# Patient Record
Sex: Male | Born: 1990 | Race: Black or African American | Hispanic: No | Marital: Single | Smoking: Current every day smoker
Health system: Southern US, Community
[De-identification: ages and names within clinical notes are randomized; demographics above are authoritative.]

## PROBLEM LIST (undated history)

## (undated) DIAGNOSIS — F1910 Other psychoactive substance abuse, uncomplicated: Secondary | ICD-10-CM

## (undated) DIAGNOSIS — Z720 Tobacco use: Secondary | ICD-10-CM

## (undated) DIAGNOSIS — F1010 Alcohol abuse, uncomplicated: Secondary | ICD-10-CM

## (undated) HISTORY — PX: TONSILLECTOMY: SUR1361

---

## 2014-07-18 ENCOUNTER — Emergency Department (HOSPITAL_COMMUNITY)
Admission: EM | Admit: 2014-07-18 | Discharge: 2014-07-19 | Disposition: A | Discharge status: Home / Self Care | Payer: Self-pay | Attending: Emergency Medicine | Admitting: Emergency Medicine

## 2014-07-18 ENCOUNTER — Encounter (HOSPITAL_COMMUNITY): Payer: Self-pay | Admitting: Emergency Medicine

## 2014-07-18 DIAGNOSIS — N492 Inflammatory disorders of scrotum: Secondary | ICD-10-CM | POA: Insufficient documentation

## 2014-07-18 DIAGNOSIS — N50819 Testicular pain, unspecified: Secondary | ICD-10-CM

## 2014-07-18 DIAGNOSIS — L089 Local infection of the skin and subcutaneous tissue, unspecified: Secondary | ICD-10-CM | POA: Insufficient documentation

## 2014-07-18 DIAGNOSIS — Z720 Tobacco use: Secondary | ICD-10-CM | POA: Insufficient documentation

## 2014-07-18 LAB — COMPREHENSIVE METABOLIC PANEL
ALK PHOS: 72 U/L (ref 39–117)
ALT: 15 U/L (ref 0–53)
AST: 22 U/L (ref 0–37)
Albumin: 3.8 g/dL (ref 3.5–5.2)
Anion gap: 13 (ref 5–15)
BUN: 15 mg/dL (ref 6–23)
CHLORIDE: 97 meq/L (ref 96–112)
CO2: 25 mEq/L (ref 19–32)
Calcium: 9.2 mg/dL (ref 8.4–10.5)
Creatinine, Ser: 1.11 mg/dL (ref 0.50–1.35)
GFR calc non Af Amer: >90 mL/min (ref >90)
GLUCOSE: 99 mg/dL (ref 70–99)
Potassium: 4 mEq/L (ref 3.7–5.3)
Sodium: 135 mEq/L — ABNORMAL LOW (ref 137–147)
Total Bilirubin: 0.3 mg/dL (ref 0.3–1.2)
Total Protein: 6.8 g/dL (ref 6.0–8.3)

## 2014-07-18 LAB — CBC WITH DIFFERENTIAL/PLATELET
Basophils Absolute: 0 10*3/uL (ref 0.0–0.1)
Basophils Relative: 0 % (ref 0–1)
Eosinophils Absolute: 0.1 10*3/uL (ref 0.0–0.7)
Eosinophils Relative: 1 % (ref 0–5)
HCT: 44.7 % (ref 39.0–52.0)
Hemoglobin: 15.2 g/dL (ref 13.0–17.0)
LYMPHS ABS: 1.9 10*3/uL (ref 0.7–4.0)
Lymphocytes Relative: 18 % (ref 12–46)
MCH: 29.5 pg (ref 26.0–34.0)
MCHC: 34 g/dL (ref 30.0–36.0)
MCV: 86.8 fL (ref 78.0–100.0)
Monocytes Absolute: 1.3 10*3/uL — ABNORMAL HIGH (ref 0.1–1.0)
Monocytes Relative: 12 % (ref 3–12)
NEUTROS ABS: 7.4 10*3/uL (ref 1.7–7.7)
NEUTROS PCT: 69 % (ref 43–77)
Platelets: 260 10*3/uL (ref 150–400)
RBC: 5.15 MIL/uL (ref 4.22–5.81)
RDW: 13.6 % (ref 11.5–15.5)
WBC: 10.7 10*3/uL — ABNORMAL HIGH (ref 4.0–10.5)

## 2014-07-18 MED ORDER — ONDANSETRON HCL 4 MG PO TABS
4.0000 mg | ORAL_TABLET | Freq: Once | ORAL | Status: AC
  Administered 2014-07-18: 4 mg via ORAL
  Filled 2014-07-18: qty 1

## 2014-07-18 MED ORDER — LIDOCAINE-EPINEPHRINE (PF) 2 %-1:200000 IJ SOLN
10.0000 mL | Freq: Once | INTRAMUSCULAR | Status: AC
  Administered 2014-07-19: 10 mL
  Filled 2014-07-18: qty 20

## 2014-07-18 MED ORDER — MORPHINE SULFATE 4 MG/ML IJ SOLN
4.0000 mg | Freq: Once | INTRAMUSCULAR | Status: AC
  Administered 2014-07-18: 4 mg via INTRAMUSCULAR
  Filled 2014-07-18: qty 1

## 2014-07-18 NOTE — ED Notes (Signed)
Pt. reports left testicular pain with swelling onset this morning , denies injury / no dysuria , no fever or chills.

## 2014-07-19 LAB — URINALYSIS, ROUTINE W REFLEX MICROSCOPIC
Bilirubin Urine: NEGATIVE
Glucose, UA: NEGATIVE mg/dL
HGB URINE DIPSTICK: NEGATIVE
Ketones, ur: 15 mg/dL — AB
Nitrite: NEGATIVE
Protein, ur: NEGATIVE mg/dL
Specific Gravity, Urine: 1.014 (ref 1.005–1.030)
Urobilinogen, UA: 0.2 mg/dL (ref 0.0–1.0)
pH: 6 (ref 5.0–8.0)

## 2014-07-19 LAB — URINE MICROSCOPIC-ADD ON

## 2014-07-19 MED ORDER — HYDROCODONE-ACETAMINOPHEN 5-325 MG PO TABS: 1.0000 | ORAL_TABLET | ORAL | Status: DC | PRN

## 2014-07-19 MED ORDER — SULFAMETHOXAZOLE-TRIMETHOPRIM 800-160 MG PO TABS: 1.0000 | ORAL_TABLET | Freq: Two times a day (BID) | ORAL | Status: DC

## 2014-07-19 NOTE — Discharge Instructions (Signed)
Try to leave the packing in for 2 days, but if it falls out sooner, that is okay. Soak in a tub of warm water for 30 minutes 3-4 times a day. Take the antibiotics until gone. You can take ibuprofen 600 mg 4 times a day with the hydrocodone for pain.  Recheck if you pain or swelling gets worse, you get a fever, have difficulty urinating or get pain in your testicles.    Abscess An abscess is an infected area that contains a collection of pus and debris.It can occur in almost any part of the body. An abscess is also known as a furuncle or boil. CAUSES  An abscess occurs when tissue gets infected. This can occur from blockage of oil or sweat glands, infection of hair follicles, or a minor injury to the skin. As the body tries to fight the infection, pus collects in the area and creates pressure under the skin. This pressure causes pain. People with weakened immune systems have difficulty fighting infections and get certain abscesses more often.  SYMPTOMS Usually an abscess develops on the skin and becomes a painful mass that is red, warm, and tender. If the abscess forms under the skin, you may feel a moveable soft area under the skin. Some abscesses break open (rupture) on their own, but most will continue to get worse without care. The infection can spread deeper into the body and eventually into the bloodstream, causing you to feel ill.  DIAGNOSIS  Your caregiver will take your medical history and perform a physical exam. A sample of fluid may also be taken from the abscess to determine what is causing your infection. TREATMENT  Your caregiver may prescribe antibiotic medicines to fight the infection. However, taking antibiotics alone usually does not cure an abscess. Your caregiver may need to make a small cut (incision) in the abscess to drain the pus. In some cases, gauze is packed into the abscess to reduce pain and to continue draining the area. HOME CARE INSTRUCTIONS   Only take  over-the-counter or prescription medicines for pain, discomfort, or fever as directed by your caregiver.  If you were prescribed antibiotics, take them as directed. Finish them even if you start to feel better.  If gauze is used, follow your caregiver's directions for changing the gauze.  To avoid spreading the infection:  Keep your draining abscess covered with a bandage.  Wash your hands well.  Do not share personal care items, towels, or whirlpools with others.  Avoid skin contact with others.  Keep your skin and clothes clean around the abscess.  Keep all follow-up appointments as directed by your caregiver. SEEK MEDICAL CARE IF:   You have increased pain, swelling, redness, fluid drainage, or bleeding.  You have muscle aches, chills, or a general ill feeling.  You have a fever. MAKE SURE YOU:   Understand these instructions.  Will watch your condition.  Will get help right away if you are not doing well or get worse. Document Released: 04/27/2005 Document Revised: 01/17/2012 Document Reviewed: 09/30/2011 [facility] Patient Information 2015 [place], [place]. This information is not intended to replace advice given to you by your health care provider. Make sure you discuss any questions you have with your health care provider.

## 2014-07-19 NOTE — ED Provider Notes (Signed)
CSN: [id number]     Arrival date & time 07/18/14  2246 History   First MD Initiated Contact with Patient 07/18/14 2300     Chief Complaint  Patient presents with  . Testicle Pain     (Consider location/radiation/quality/duration/timing/severity/associated sxs/prior Treatment) HPI  Patient reported last night he started having some swelling on the left side of his scrotum that was not painful. However he relates today it's getting larger and is getting more painful. He reports he has been going to the gym and over the last couple weeks he has had some scattered pustules on his body that drain. He has never had a boil before. He denies any fever. He denies any pain in his testicles. He denies any difficulty urinating. He denies any abdominal pain, nausea or vomiting. He relates pain with walking. He also has constant pain.   [facility]  History reviewed. No pertinent past medical history. History reviewed. No pertinent past surgical history. No family history on file. History  Substance Use Topics  . Smoking status: Current Every Day Smoker  . Smokeless tobacco: Not on file  . Alcohol Use: Yes  college student Smokes 1-2 black and milds daily  Review of Systems  All other systems reviewed and are negative.     Allergies  Review of patient's allergies indicates no known allergies.  Home Medications   Prior to Admission medications   Medication Sig Start Date End Date Taking? Authorizing Provider  mirtazapine (REMERON) 7.5 MG tablet Take 7.5 mg by mouth at bedtime.   Yes Historical Provider, MD   BP 138/87 mmHg  Pulse 74  Temp(Src) 99.2 F (37.3 C) (Oral)  Resp 18  Ht 6\' 2"  (1.88 m)  Wt 172 lb (78.019 kg)  BMI 22.07 kg/m2  SpO2 100%  Vital signs normal   Physical Exam  Constitutional: He is oriented to person, place, and time. He appears well-developed and well-nourished.  Non-toxic appearance. He does not appear ill. No distress.  HENT:  Head:  Normocephalic and atraumatic.  Right Ear: External ear normal.  Left Ear: External ear normal.  Nose: Nose normal. No mucosal edema or rhinorrhea.  Mouth/Throat: Mucous membranes are normal. No dental abscesses or uvula swelling.  Eyes: Conjunctivae and EOM are normal. Pupils are equal, round, and reactive to light.  Neck: Normal range of motion and full passive range of motion without pain. Neck supple.  Pulmonary/Chest: Effort normal and breath sounds normal. No respiratory distress. He has no rhonchi. He exhibits no crepitus.  Abdominal: Soft. Normal appearance and bowel sounds are normal. He exhibits no distension. There is no tenderness. There is no rebound and no guarding.  Genitourinary:  Normal external genitalia, has a firm area on his posterior left scrotum with a small pustule in the center, the swelling is about 1.5 x 2 cm in size.  Testicles are normal sized, nontender and without masses.   Musculoskeletal: Normal range of motion. He exhibits no edema or tenderness.  Moves all extremities well.   Neurological: He is alert and oriented to person, place, and time. He has normal strength. No cranial nerve deficit.  Skin: Skin is warm, dry and intact. No rash noted. No erythema. No pallor.  Sweating (states he is nervous) Pt has a small pustule on his right forarm  Psychiatric: He has a normal mood and affect. His speech is normal and behavior is normal. His mood appears not anxious.  Nursing note and vitals reviewed.   ED  Course  Procedures (including critical care time)  Medications  morphine 4 MG/ML injection 4 mg (4 mg Intramuscular Given 07/18/14 2319)  ondansetron (ZOFRAN) tablet 4 mg (4 mg Oral Given 07/18/14 2319)  lidocaine-EPINEPHrine (XYLOCAINE W/EPI) 2 %-1:200000 (PF) injection 10 mL (10 mLs Infiltration Given by Other 07/19/14 0046)    INCISION AND DRAINAGE Performed by: JYNWG,NFAKNAPP,Annaleah Arata L Consent: Verbal consent obtained. Risks and benefits: risks, benefits and  alternatives were discussed Type: abscess  Body area: left scrotum   Anesthesia: local infiltration  Incision was made with a 11 scalpel.  Local anesthetic: lidocaine 1% + 1% epinephrine  Anesthetic total: 1 ml  Complexity: complex Blunt dissection to break up loculations  Drainage: purulent  Drainage amount: small  Packing material: 1/4 in iodoform gauze  Patient tolerance: Patient tolerated the procedure well with no immediate complications.     Labs Review Results for orders placed or performed during the hospital encounter of 07/18/14  CBC with Differential  Result Value Ref Range   WBC 10.7 (H) 4.0 - 10.5 K/uL   RBC 5.15 4.22 - 5.81 MIL/uL   Hemoglobin 15.2 13.0 - 17.0 g/dL   HCT 21.344.7 08.639.0 - 57.852.0 %   MCV 86.8 78.0 - 100.0 fL   MCH 29.5 26.0 - 34.0 pg   MCHC 34.0 30.0 - 36.0 g/dL   RDW 46.913.6 62.911.5 - 52.815.5 %   Platelets 260 150 - 400 K/uL   Neutrophils Relative % 69 43 - 77 %   Neutro Abs 7.4 1.7 - 7.7 K/uL   Lymphocytes Relative 18 12 - 46 %   Lymphs Abs 1.9 0.7 - 4.0 K/uL   Monocytes Relative 12 3 - 12 %   Monocytes Absolute 1.3 (H) 0.1 - 1.0 K/uL   Eosinophils Relative 1 0 - 5 %   Eosinophils Absolute 0.1 0.0 - 0.7 K/uL   Basophils Relative 0 0 - 1 %   Basophils Absolute 0.0 0.0 - 0.1 K/uL  Comprehensive metabolic panel  Result Value Ref Range   Sodium 135 (L) 137 - 147 mEq/L   Potassium 4.0 3.7 - 5.3 mEq/L   Chloride 97 96 - 112 mEq/L   CO2 25 19 - 32 mEq/L   Glucose, Bld 99 70 - 99 mg/dL   BUN 15 6 - 23 mg/dL   Creatinine, Ser 4.131.11 0.50 - 1.35 mg/dL   Calcium 9.2 8.4 - 24.410.5 mg/dL   Total Protein 6.8 6.0 - 8.3 g/dL   Albumin 3.8 3.5 - 5.2 g/dL   AST 22 0 - 37 U/L   ALT 15 0 - 53 U/L   Alkaline Phosphatase 72 39 - 117 U/L   Total Bilirubin 0.3 0.3 - 1.2 mg/dL   GFR calc non Af Amer >90 >90 mL/min   GFR calc Af Amer >90 >90 mL/min   Anion gap 13 5 - 15   Laboratory interpretation all normal except leukcytosis    Imaging Review No results  found.   EKG Interpretation None      MDM   Final diagnoses:  Scrotal abscess    New Prescriptions   HYDROCODONE-ACETAMINOPHEN (NORCO/VICODIN) 5-325 MG PER TABLET    Take 1 tablet by mouth every 4 (four) hours as needed for moderate pain.   SULFAMETHOXAZOLE-TRIMETHOPRIM (BACTRIM DS,SEPTRA DS) 800-160 MG PER TABLET    Take 1 tablet by mouth 2 (two) times daily.    Plan discharge  Devoria AlbeIva Jataya Wann, MD, [facility] DellFACEP      Amarien Carne L Leotha Voeltz, MD 07/19/14 [phone]

## 2014-07-20 ENCOUNTER — Encounter (HOSPITAL_COMMUNITY): Payer: Self-pay | Admitting: *Deleted

## 2014-07-20 ENCOUNTER — Emergency Department (HOSPITAL_COMMUNITY)
Admission: EM | Admit: 2014-07-20 | Discharge: 2014-07-20 | Disposition: A | Discharge status: Home / Self Care | Payer: BC Managed Care – PPO | Attending: Emergency Medicine | Admitting: Emergency Medicine

## 2014-07-20 ENCOUNTER — Emergency Department (HOSPITAL_COMMUNITY): Payer: BC Managed Care – PPO

## 2014-07-20 DIAGNOSIS — Z720 Tobacco use: Secondary | ICD-10-CM | POA: Insufficient documentation

## 2014-07-20 DIAGNOSIS — Z79899 Other long term (current) drug therapy: Secondary | ICD-10-CM | POA: Insufficient documentation

## 2014-07-20 DIAGNOSIS — L02215 Cutaneous abscess of perineum: Secondary | ICD-10-CM | POA: Insufficient documentation

## 2014-07-20 LAB — BASIC METABOLIC PANEL
Anion gap: 12 (ref 5–15)
BUN: 13 mg/dL (ref 6–23)
CHLORIDE: 102 meq/L (ref 96–112)
CO2: 26 mEq/L (ref 19–32)
Calcium: 8.6 mg/dL (ref 8.4–10.5)
Creatinine, Ser: 1.09 mg/dL (ref 0.50–1.35)
GFR calc non Af Amer: >90 mL/min (ref >90)
Glucose, Bld: 92 mg/dL (ref 70–99)
POTASSIUM: 4.5 meq/L (ref 3.7–5.3)
Sodium: 140 mEq/L (ref 137–147)

## 2014-07-20 LAB — CBC WITH DIFFERENTIAL/PLATELET
BASOS PCT: 0 % (ref 0–1)
Basophils Absolute: 0 10*3/uL (ref 0.0–0.1)
EOS ABS: 0.2 10*3/uL (ref 0.0–0.7)
Eosinophils Relative: 3 % (ref 0–5)
HEMATOCRIT: 42.5 % (ref 39.0–52.0)
Hemoglobin: 14.3 g/dL (ref 13.0–17.0)
Lymphocytes Relative: 30 % (ref 12–46)
Lymphs Abs: 2.3 10*3/uL (ref 0.7–4.0)
MCH: 29.3 pg (ref 26.0–34.0)
MCHC: 33.6 g/dL (ref 30.0–36.0)
MCV: 87.1 fL (ref 78.0–100.0)
MONO ABS: 1 10*3/uL (ref 0.1–1.0)
Monocytes Relative: 14 % — ABNORMAL HIGH (ref 3–12)
NEUTROS ABS: 3.9 10*3/uL (ref 1.7–7.7)
Neutrophils Relative %: 53 % (ref 43–77)
Platelets: 238 10*3/uL (ref 150–400)
RBC: 4.88 MIL/uL (ref 4.22–5.81)
RDW: 13.5 % (ref 11.5–15.5)
WBC: 7.4 10*3/uL (ref 4.0–10.5)

## 2014-07-20 MED ORDER — LIDOCAINE HCL (PF) 1 % IJ SOLN
5.0000 mL | Freq: Once | INTRAMUSCULAR | Status: DC
  Filled 2014-07-20: qty 5

## 2014-07-20 MED ORDER — OXYCODONE-ACETAMINOPHEN 5-325 MG PO TABS: 1.0000 | ORAL_TABLET | ORAL | Status: DC | PRN

## 2014-07-20 MED ORDER — MORPHINE SULFATE 4 MG/ML IJ SOLN
4.0000 mg | Freq: Once | INTRAMUSCULAR | Status: AC
  Administered 2014-07-20: 4 mg via INTRAVENOUS
  Filled 2014-07-20: qty 1

## 2014-07-20 MED ORDER — IOHEXOL 300 MG/ML  SOLN
100.0000 mL | Freq: Once | INTRAMUSCULAR | Status: AC | PRN
  Administered 2014-07-20: 100 mL via INTRAVENOUS

## 2014-07-20 MED ORDER — MORPHINE SULFATE 4 MG/ML IJ SOLN
4.0000 mg | Freq: Once | INTRAMUSCULAR | Status: AC
Start: 2014-07-20 — End: 2014-07-20
  Administered 2014-07-20: 4 mg via INTRAVENOUS
  Filled 2014-07-20: qty 1

## 2014-07-20 MED ORDER — LIDOCAINE-EPINEPHRINE-TETRACAINE (LET) SOLUTION
3.0000 mL | Freq: Once | NASAL | Status: AC
  Administered 2014-07-20: 3 mL via TOPICAL
  Filled 2014-07-20: qty 3

## 2014-07-20 NOTE — ED Notes (Signed)
Pt advised he cannot drive after receiving iv narcotic pain meds, pt states he has a friend coming to pick him up upon discharge.

## 2014-07-20 NOTE — ED Notes (Signed)
MD at bedside. 

## 2014-07-20 NOTE — Discharge Instructions (Signed)
Abscess °An abscess (boil or furuncle) is an infected area on or under the skin. This area is filled with yellowish-white fluid (pus) and other material (debris). °HOME CARE  °· Only take medicines as told by your doctor. °· If you were given antibiotic medicine, take it as directed. Finish the medicine even if you start to feel better. °· If gauze is used, follow your doctor's directions for changing the gauze. °· To avoid spreading the infection: °¨ Keep your abscess covered with a bandage. °¨ Wash your hands well. °¨ Do not share personal care items, towels, or whirlpools with others. °¨ Avoid skin contact with others. °· Keep your skin and clothes clean around the abscess. °· Keep all doctor visits as told. °GET HELP RIGHT AWAY IF:  °· You have more pain, puffiness (swelling), or redness in the wound site. °· You have more fluid or blood coming from the wound site. °· You have muscle aches, chills, or you feel sick. °· You have a fever. °MAKE SURE YOU:  °· Understand these instructions. °· Will watch your condition. °· Will get help right away if you are not doing well or get worse. °Document Released: 01/04/2008 Document Revised: 01/17/2012 Document Reviewed: 09/30/2011 °ExitCare® Patient Information ©2015 [employer], [employer]. This information is not intended to replace advice given to you by your health care provider. Make sure you discuss any questions you have with your health care provider. ° °

## 2014-07-20 NOTE — ED Provider Notes (Signed)
CSN: [id number]     Arrival date & time 07/20/14  1810 History   First MD Initiated Contact with Patient 07/20/14 1822     Chief Complaint  Patient presents with  . Abscess     (Consider location/radiation/quality/duration/timing/severity/associated sxs/prior Treatment) Patient is a 23 y.o. male presenting with abscess. The history is provided by the patient. No language interpreter was used.  Abscess Location:  Ano-genital Ano-genital abscess location:  Scrotum and perineum Size:  4cm Abscess quality: draining, induration and painful   Red streaking: no   Duration:  3 days Progression since onset: swellng is not worse, pain has not improved. Pain details:    Quality:  Sharp   Severity:  Severe   Duration:  3 days   Progression:  Unchanged Chronicity:  New Relieved by:  Nothing Worsened by:  Nothing tried Ineffective treatments:  Oral antibiotics (norco) Associated symptoms: no anorexia, no fatigue, no fever, no headaches, no nausea and no vomiting     History reviewed. No pertinent past medical history. History reviewed. No pertinent past surgical history. History reviewed. No pertinent family history. History  Substance Use Topics  . Smoking status: Current Every Day Smoker  . Smokeless tobacco: Not on file  . Alcohol Use: Yes    Review of Systems  Constitutional: Negative for fever, activity change, appetite change and fatigue.  HENT: Negative for congestion, facial swelling, rhinorrhea and trouble swallowing.   Eyes: Negative for photophobia and pain.  Respiratory: Negative for cough, chest tightness and shortness of breath.   Cardiovascular: Negative for chest pain and leg swelling.  Gastrointestinal: Negative for nausea, vomiting, abdominal pain, diarrhea, constipation and anorexia.  Endocrine: Negative for polydipsia and polyuria.  Genitourinary: Negative for dysuria, urgency, decreased urine volume and difficulty urinating.  Musculoskeletal: Negative for  back pain and gait problem.  Skin: Negative for color change, rash and wound.  Allergic/Immunologic: Negative for immunocompromised state.  Neurological: Negative for dizziness, facial asymmetry, speech difficulty, weakness, numbness and headaches.  Psychiatric/Behavioral: Negative for confusion, decreased concentration and agitation.      Allergies  Review of patient's allergies indicates no known allergies.  Home Medications   Prior to Admission medications   Medication Sig Start Date End Date Taking? Authorizing Provider  HYDROcodone-acetaminophen (NORCO/VICODIN) 5-325 MG per tablet Take 1 tablet by mouth every 4 (four) hours as needed for moderate pain. 07/19/14  Yes Ward Givens, MD  mirtazapine (REMERON) 7.5 MG tablet Take 7.5 mg by mouth at bedtime.   Yes Historical Provider, MD  sulfamethoxazole-trimethoprim (BACTRIM DS,SEPTRA DS) 800-160 MG per tablet Take 1 tablet by mouth 2 (two) times daily. 07/19/14  Yes Ward Givens, MD  oxyCODONE-acetaminophen (PERCOCET) 5-325 MG per tablet Take 1-2 tablets by mouth every 4 (four) hours as needed. 07/20/14   Toy Cookey, MD   BP 139/79 mmHg  Pulse 70  Temp(Src) 98.2 F (36.8 C) (Oral)  Resp 20  Ht 6\' 2"  (1.88 m)  Wt 172 lb (78.019 kg)  BMI 22.07 kg/m2  SpO2 100% Physical Exam  Constitutional: He is oriented to person, place, and time. He appears well-developed and well-nourished. No distress.  HENT:  Head: Normocephalic and atraumatic.  Mouth/Throat: No oropharyngeal exudate.  Eyes: Pupils are equal, round, and reactive to light.  Neck: Normal range of motion. Neck supple.  Cardiovascular: Normal rate, regular rhythm and normal heart sounds.  Exam reveals no gallop and no friction rub.   No murmur heard. Pulmonary/Chest: Effort normal and breath sounds normal. No respiratory  distress. He has no wheezes. He has no rales.  Abdominal: Soft. Bowel sounds are normal. He exhibits no distension and no mass. There is no tenderness.  There is no rebound and no guarding.  Genitourinary:     Musculoskeletal: Normal range of motion. He exhibits no edema or tenderness.  Neurological: He is alert and oriented to person, place, and time.  Skin: Skin is warm and dry.  Psychiatric: He has a normal mood and affect.    ED Course  INCISION AND DRAINAGE Date/Time: 07/20/2014 11:17 PM Performed by: Toy CookeyHERTY, Lynsey Ange Authorized by: Toy CookeyHERTY, Liliah Dorian Consent: Verbal consent obtained. Written consent not obtained. Risks and benefits: risks, benefits and alternatives were discussed Consent given by: patient Patient understanding: patient states understanding of the procedure being performed Patient consent: the patient's understanding of the procedure matches consent given Procedure consent: procedure consent matches procedure scheduled Relevant documents: relevant documents present and verified Test results: test results available and properly labeled Site marked: the operative site was marked Imaging studies: imaging studies available Required items: required blood products, implants, devices, and special equipment available Patient identity confirmed: verbally with patient Time out: Immediately prior to procedure a "time out" was called to verify the correct patient, procedure, equipment, support staff and site/side marked as required. Type: abscess Body area: anogenital Location details: perineum Anesthesia: local infiltration Local anesthetic: lidocaine 1% without epinephrine Anesthetic total: 2 ml Patient sedated: no Scalpel size: 11 Needle gauge: 27. Incision type: single straight Complexity: simple Drainage: purulent Drainage amount: scant Packing material: 1/4 in iodoform gauze Patient tolerance: Patient tolerated the procedure well with no immediate complications   (including critical care time) Labs Review Labs Reviewed  CBC WITH DIFFERENTIAL - Abnormal; Notable for the following:    Monocytes Relative 14  (*)    All other components within normal limits  BASIC METABOLIC PANEL    Imaging Review Ct Pelvis W Contrast  07/20/2014   CLINICAL DATA:  The patient reports a left testicular abscess, fever, pain and swelling  EXAM: CT PELVIS WITH CONTRAST  TECHNIQUE: Multidetector CT imaging of the pelvis was performed using the standard protocol following the bolus administration of intravenous contrast.  CONTRAST:  100mL OMNIPAQUE IOHEXOL 300 MG/ML  SOLN  COMPARISON:  None.  FINDINGS: There is a rim enhancing subcutaneous fluid collection within the left perineum measuring 2.4 x 1.4 by 1.0 cm. Trace pelvic fluid is identified image 29. Visualized bowel is unremarkable. Bladder is normal. Mildly prominent inguinal nodes are identified without overt lymphadenopathy. No acute osseous abnormality.  IMPRESSION: Rim enhancing left perineal collection compatible with soft tissue abscess. No evidence for intrapelvic extension.  Small amount of free fluid is noted within the pelvis which is generally considered abnormal in male patients but is nonspecific and unlikely to be clinically significant in the absence of trauma or suspected intra-abdominal pathology.   Electronically Signed   By: Christiana PellantGretchen  Green M.D.   On: 07/20/2014 19:56     EKG Interpretation None      MDM   Final diagnoses:  Perineal abscess    Pt is a 23 y.o. male with Pmhx as above who presents with continued scrotal and perineal pain since having an I&D of a left scrotal abscess done 2 days ago.  He states the packing has since come out and he feels that this tight site is still breast continued to have significant pain despite taking Norco every 3-4 hours.  He denies fever, chills, abdominal pain, difficulty urinating or defecating.  On physical exam, vital signs are stable and he said no acute distress.  Abdominal exam is benign.  He has soft tissue edema to the perineum and in for a posterior left testicle.  He appears to have had a small  pustule which was the site of the I&D.  There is draining a scant amount of purulent fluid.  He does not have focal tenderness over bilateral testicles  11:43 PM I&D done with scant purulent fluid returned. 1/4 iodoform gauze placed. Will speak with urology to arrange outpt follow up. He can continue PO abx.    Rashi Morozov evaluatLenda Kelpion in the Emergency Department is complete. It has been determined that no acute conditions requiring further emergency intervention are present at this time. The patient/guardian have been advised of the diagnosis and plan. We have discussed signs and symptoms that warrant return to the ED, such as changes or worsening in symptoms, worsening pain, swelling fever.       Toy CookeyMegan Maiana Hennigan, MD 07/20/14 [phone]

## 2014-07-20 NOTE — ED Notes (Signed)
Pt reports  Being seen on Friday for abscess on his testicle, had it drained and given meds but reports no relief. Denies fever.

## 2015-01-29 ENCOUNTER — Emergency Department (HOSPITAL_COMMUNITY)
Admission: EM | Admit: 2015-01-29 | Discharge: 2015-01-29 | Disposition: A | Discharge status: Home / Self Care | Payer: BLUE CROSS/BLUE SHIELD | Attending: Emergency Medicine | Admitting: Emergency Medicine

## 2015-01-29 ENCOUNTER — Encounter (HOSPITAL_COMMUNITY): Payer: Self-pay | Admitting: Emergency Medicine

## 2015-01-29 DIAGNOSIS — R51 Headache: Secondary | ICD-10-CM | POA: Diagnosis not present

## 2015-01-29 DIAGNOSIS — Z720 Tobacco use: Secondary | ICD-10-CM | POA: Insufficient documentation

## 2015-01-29 DIAGNOSIS — Z792 Long term (current) use of antibiotics: Secondary | ICD-10-CM | POA: Diagnosis not present

## 2015-01-29 DIAGNOSIS — R1013 Epigastric pain: Secondary | ICD-10-CM | POA: Insufficient documentation

## 2015-01-29 DIAGNOSIS — R112 Nausea with vomiting, unspecified: Secondary | ICD-10-CM | POA: Diagnosis not present

## 2015-01-29 DIAGNOSIS — R519 Headache, unspecified: Secondary | ICD-10-CM

## 2015-01-29 LAB — CBC WITH DIFFERENTIAL/PLATELET
Basophils Absolute: 0 10*3/uL (ref 0.0–0.1)
Basophils Relative: 0 % (ref 0–1)
EOS ABS: 0.2 10*3/uL (ref 0.0–0.7)
Eosinophils Relative: 5 % (ref 0–5)
HCT: 40.6 % (ref 39.0–52.0)
Hemoglobin: 13.9 g/dL (ref 13.0–17.0)
Lymphocytes Relative: 54 % — ABNORMAL HIGH (ref 12–46)
Lymphs Abs: 2.4 10*3/uL (ref 0.7–4.0)
MCH: 29.9 pg (ref 26.0–34.0)
MCHC: 34.2 g/dL (ref 30.0–36.0)
MCV: 87.3 fL (ref 78.0–100.0)
MONOS PCT: 16 % — AB (ref 3–12)
Monocytes Absolute: 0.7 10*3/uL (ref 0.1–1.0)
NEUTROS PCT: 25 % — AB (ref 43–77)
Neutro Abs: 1.1 10*3/uL — ABNORMAL LOW (ref 1.7–7.7)
Platelets: 237 10*3/uL (ref 150–400)
RBC: 4.65 MIL/uL (ref 4.22–5.81)
RDW: 12.9 % (ref 11.5–15.5)
WBC: 4.4 10*3/uL (ref 4.0–10.5)

## 2015-01-29 LAB — COMPREHENSIVE METABOLIC PANEL
ALBUMIN: 3.9 g/dL (ref 3.5–5.0)
ALT: 12 U/L — ABNORMAL LOW (ref 17–63)
AST: 27 U/L (ref 15–41)
Alkaline Phosphatase: 57 U/L (ref 38–126)
Anion gap: 6 (ref 5–15)
BUN: 8 mg/dL (ref 6–20)
CALCIUM: 8.8 mg/dL — AB (ref 8.9–10.3)
CO2: 25 mmol/L (ref 22–32)
Chloride: 110 mmol/L (ref 101–111)
Creatinine, Ser: 1.15 mg/dL (ref 0.61–1.24)
GFR calc Af Amer: >60 mL/min (ref >60)
Glucose, Bld: 93 mg/dL (ref 65–99)
Potassium: 3.9 mmol/L (ref 3.5–5.1)
SODIUM: 141 mmol/L (ref 135–145)
Total Bilirubin: 1 mg/dL (ref 0.3–1.2)
Total Protein: 6 g/dL — ABNORMAL LOW (ref 6.5–8.1)

## 2015-01-29 LAB — LIPASE, BLOOD: Lipase: 26 U/L (ref 22–51)

## 2015-01-29 MED ORDER — KETOROLAC TROMETHAMINE 30 MG/ML IJ SOLN
30.0000 mg | Freq: Once | INTRAMUSCULAR | Status: AC
  Administered 2015-01-29: 30 mg via INTRAVENOUS
  Filled 2015-01-29: qty 1

## 2015-01-29 MED ORDER — SODIUM CHLORIDE 0.9 % IV BOLUS (SEPSIS)
1000.0000 mL | Freq: Once | INTRAVENOUS | Status: AC
  Administered 2015-01-29: 1000 mL via INTRAVENOUS

## 2015-01-29 MED ORDER — IBUPROFEN 600 MG PO TABS: 600.0000 mg | ORAL_TABLET | Freq: Four times a day (QID) | ORAL | Status: DC | PRN

## 2015-01-29 MED ORDER — PROMETHAZINE HCL 25 MG PO TABS: 25.0000 mg | ORAL_TABLET | Freq: Four times a day (QID) | ORAL | Status: DC | PRN

## 2015-01-29 MED ORDER — ONDANSETRON HCL 4 MG/2ML IJ SOLN
4.0000 mg | Freq: Once | INTRAMUSCULAR | Status: DC
  Filled 2015-01-29: qty 2

## 2015-01-29 MED ORDER — ONDANSETRON HCL 4 MG/2ML IJ SOLN
4.0000 mg | Freq: Once | INTRAMUSCULAR | Status: AC
  Administered 2015-01-29: 4 mg via INTRAVENOUS

## 2015-01-29 NOTE — Discharge Instructions (Signed)
Headaches, Frequently Asked Questions °MIGRAINE HEADACHES °Q: What is migraine? What causes it? How can I treat it? °A: Generally, migraine headaches begin as a dull ache. Then they develop into a constant, throbbing, and pulsating pain. You may experience pain at the temples. You may experience pain at the front or back of one or both sides of the head. The pain is usually accompanied by a combination of: °· Nausea. °· Vomiting. °· Sensitivity to light and noise. °Some people (about 15%) experience an aura (see below) before an attack. The cause of migraine is believed to be chemical reactions in the brain. Treatment for migraine may include over-the-counter or prescription medications. It may also include self-help techniques. These include relaxation training and biofeedback.  °Q: What is an aura? °A: About 15% of people with migraine get an "aura". This is a sign of neurological symptoms that occur before a migraine headache. You may see wavy or jagged lines, dots, or flashing lights. You might experience tunnel vision or blind spots in one or both eyes. The aura can include visual or auditory hallucinations (something imagined). It may include disruptions in smell (such as strange odors), taste or touch. Other symptoms include: °· Numbness. °· A "pins and needles" sensation. °· Difficulty in recalling or speaking the correct word. °These neurological events may last as long as 60 minutes. These symptoms will fade as the headache begins. °Q: What is a trigger? °A: Certain physical or environmental factors can lead to or "trigger" a migraine. These include: °· Foods. °· Hormonal changes. °· Weather. °· Stress. °It is important to remember that triggers are different for everyone. To help prevent migraine attacks, you need to figure out which triggers affect you. Keep a headache diary. This is a good way to track triggers. The diary will help you talk to your healthcare professional about your condition. °Q: Does  weather affect migraines? °A: Bright sunshine, hot, humid conditions, and drastic changes in barometric pressure may lead to, or "trigger," a migraine attack in some people. But studies have shown that weather does not act as a trigger for everyone with migraines. °Q: What is the link between migraine and hormones? °A: Hormones start and regulate many of your body's functions. Hormones keep your body in balance within a constantly changing environment. The levels of hormones in your body are unbalanced at times. Examples are during menstruation, pregnancy, or menopause. That can lead to a migraine attack. In fact, about three quarters of all women with migraine report that their attacks are related to the menstrual cycle.  °Q: Is there an increased risk of stroke for migraine sufferers? °A: The likelihood of a migraine attack causing a stroke is very remote. That is not to say that migraine sufferers cannot have a stroke associated with their migraines. In persons under age 40, the most common associated factor for stroke is migraine headache. But over the course of a person's normal life span, the occurrence of migraine headache may actually be associated with a reduced risk of dying from cerebrovascular disease due to stroke.  °Q: What are acute medications for migraine? °A: Acute medications are used to treat the pain of the headache after it has started. Examples over-the-counter medications, NSAIDs, ergots, and triptans.  °Q: What are the triptans? °A: Triptans are the newest class of abortive medications. They are specifically targeted to treat migraine. Triptans are vasoconstrictors. They moderate some chemical reactions in the brain. The triptans work on receptors in your brain. Triptans help   to restore the balance of a neurotransmitter called serotonin. Fluctuations in levels of serotonin are thought to be a main cause of migraine.  °Q: Are over-the-counter medications for migraine effective? °A:  Over-the-counter, or "OTC," medications may be effective in relieving mild to moderate pain and associated symptoms of migraine. But you should see your caregiver before beginning any treatment regimen for migraine.  °Q: What are preventive medications for migraine? °A: Preventive medications for migraine are sometimes referred to as "prophylactic" treatments. They are used to reduce the frequency, severity, and length of migraine attacks. Examples of preventive medications include antiepileptic medications, antidepressants, beta-blockers, calcium channel blockers, and NSAIDs (nonsteroidal anti-inflammatory drugs). °Q: Why are anticonvulsants used to treat migraine? °A: During the past few years, there has been an increased interest in antiepileptic drugs for the prevention of migraine. They are sometimes referred to as "anticonvulsants". Both epilepsy and migraine may be caused by similar reactions in the brain.  °Q: Why are antidepressants used to treat migraine? °A: Antidepressants are typically used to treat people with depression. They may reduce migraine frequency by regulating chemical levels, such as serotonin, in the brain.  °Q: What alternative therapies are used to treat migraine? °A: The term "alternative therapies" is often used to describe treatments considered outside the scope of conventional Western medicine. Examples of alternative therapy include acupuncture, acupressure, and yoga. Another common alternative treatment is herbal therapy. Some herbs are believed to relieve headache pain. Always discuss alternative therapies with your caregiver before proceeding. Some herbal products contain arsenic and other toxins. °TENSION HEADACHES °Q: What is a tension-type headache? What causes it? How can I treat it? °A: Tension-type headaches occur randomly. They are often the result of temporary stress, anxiety, fatigue, or anger. Symptoms include soreness in your temples, a tightening band-like sensation  around your head (a "vice-like" ache). Symptoms can also include a pulling feeling, pressure sensations, and contracting head and neck muscles. The headache begins in your forehead, temples, or the back of your head and neck. Treatment for tension-type headache may include over-the-counter or prescription medications. Treatment may also include self-help techniques such as relaxation training and biofeedback. °CLUSTER HEADACHES °Q: What is a cluster headache? What causes it? How can I treat it? °A: Cluster headache gets its name because the attacks come in groups. The pain arrives with little, if any, warning. It is usually on one side of the head. A tearing or bloodshot eye and a runny nose on the same side of the headache may also accompany the pain. Cluster headaches are believed to be caused by chemical reactions in the brain. They have been described as the most severe and intense of any headache type. Treatment for cluster headache includes prescription medication and oxygen. °SINUS HEADACHES °Q: What is a sinus headache? What causes it? How can I treat it? °A: When a cavity in the bones of the face and skull (a sinus) becomes inflamed, the inflammation will cause localized pain. This condition is usually the result of an allergic reaction, a tumor, or an infection. If your headache is caused by a sinus blockage, such as an infection, you will probably have a fever. An x-ray will confirm a sinus blockage. Your caregiver's treatment might include antibiotics for the infection, as well as antihistamines or decongestants.  °REBOUND HEADACHES °Q: What is a rebound headache? What causes it? How can I treat it? °A: A pattern of taking acute headache medications too often can lead to a condition known as "rebound headache."   A pattern of taking too much headache medication includes taking it more than 2 days per week or in excessive amounts. That means more than the label or a caregiver advises. With rebound  headaches, your medications not only stop relieving pain, they actually begin to cause headaches. Doctors treat rebound headache by tapering the medication that is being overused. Sometimes your caregiver will gradually substitute a different type of treatment or medication. Stopping may be a challenge. Regularly overusing a medication increases the potential for serious side effects. Consult a caregiver if you regularly use headache medications more than 2 days per week or more than the label advises. ADDITIONAL QUESTIONS AND ANSWERS Q: What is biofeedback? A: Biofeedback is a self-help treatment. Biofeedback uses special equipment to monitor your body's involuntary physical responses. Biofeedback monitors:  Breathing.  Pulse.  Heart rate.  Temperature.  Muscle tension.  Brain activity. Biofeedback helps you refine and perfect your relaxation exercises. You learn to control the physical responses that are related to stress. Once the technique has been mastered, you do not need the equipment any more. Q: Are headaches hereditary? A: Four out of five (80%) of people that suffer report a family history of migraine. Scientists are not sure if this is genetic or a family predisposition. Despite the uncertainty, a child has a 50% chance of having migraine if one parent suffers. The child has a 75% chance if both parents suffer.  Q: Can children get headaches? A: By the time they reach high school, most young people have experienced some type of headache. Many safe and effective approaches or medications can prevent a headache from occurring or stop it after it has begun.  Q: What type of doctor should I see to diagnose and treat my headache? A: Start with your primary caregiver. Discuss his or her experience and approach to headaches. Discuss methods of classification, diagnosis, and treatment. Your caregiver may decide to recommend you to a headache specialist, depending upon your symptoms or other  physical conditions. Having diabetes, allergies, etc., may require a more comprehensive and inclusive approach to your headache. The National Headache Foundation will provide, upon request, a list of [facility] physician members in your state. Document Released: 10/08/2003 Document Revised: 10/10/2011 Document Reviewed: 03/17/2008 [facility] Patient Information 2015 [place], [place]. This information is not intended to replace advice given to you by your health care provider. Make sure you discuss any questions you have with your health care provider.  Nausea and Vomiting Nausea is a sick feeling that often comes before throwing up (vomiting). Vomiting is a reflex where stomach contents come out of your mouth. Vomiting can cause severe loss of body fluids (dehydration). Children and elderly adults can become dehydrated quickly, especially if they also have diarrhea. Nausea and vomiting are symptoms of a condition or disease. It is important to find the cause of your symptoms. CAUSES   Direct irritation of the stomach lining. This irritation can result from increased acid production (gastroesophageal reflux disease), infection, food poisoning, taking certain medicines (such as nonsteroidal anti-inflammatory drugs), alcohol use, or tobacco use.  Signals from the brain.These signals could be caused by a headache, heat exposure, an inner ear disturbance, increased pressure in the brain from injury, infection, a tumor, or a concussion, pain, emotional stimulus, or metabolic problems.  An obstruction in the gastrointestinal tract (bowel obstruction).  Illnesses such as diabetes, hepatitis, gallbladder problems, appendicitis, kidney problems, cancer, sepsis, atypical symptoms of a heart attack, or eating disorders.  Medical treatments such  as chemotherapy and radiation.  Receiving medicine that makes you sleep (general anesthetic) during surgery. DIAGNOSIS Your caregiver may ask for tests to be done if the  problems do not improve after a few days. Tests may also be done if symptoms are severe or if the reason for the nausea and vomiting is not clear. Tests may include:  Urine tests.  Blood tests.  Stool tests.  Cultures (to look for evidence of infection).  X-rays or other imaging studies. Test results can help your caregiver make decisions about treatment or the need for additional tests. TREATMENT You need to stay well hydrated. Drink frequently but in small amounts.You may wish to drink water, sports drinks, clear broth, or eat frozen ice pops or gelatin dessert to help stay hydrated.When you eat, eating slowly may help prevent nausea.There are also some antinausea medicines that may help prevent nausea. HOME CARE INSTRUCTIONS   Take all medicine as directed by your caregiver.  If you do not have an appetite, do not force yourself to eat. However, you must continue to drink fluids.  If you have an appetite, eat a normal diet unless your caregiver tells you differently.  Eat a variety of complex carbohydrates (rice, wheat, potatoes, bread), lean meats, yogurt, fruits, and vegetables.  Avoid high-fat foods because they are more difficult to digest.  Drink enough water and fluids to keep your urine clear or pale yellow.  If you are dehydrated, ask your caregiver for specific rehydration instructions. Signs of dehydration may include:  Severe thirst.  Dry lips and mouth.  Dizziness.  Dark urine.  Decreasing urine frequency and amount.  Confusion.  Rapid breathing or pulse. SEEK IMMEDIATE MEDICAL CARE IF:   You have blood or brown flecks (like coffee grounds) in your vomit.  You have black or bloody stools.  You have a severe headache or stiff neck.  You are confused.  You have severe abdominal pain.  You have chest pain or trouble breathing.  You do not urinate at least once every 8 hours.  You develop cold or clammy skin.  You continue to vomit for longer  than 24 to 48 hours.  You have a fever. MAKE SURE YOU:   Understand these instructions.  Will watch your condition.  Will get help right away if you are not doing well or get worse. Document Released: 07/18/2005 Document Revised: 10/10/2011 Document Reviewed: 12/15/2010 [facility] Patient Information 2015 [place], [place]. This information is not intended to replace advice given to you by your health care provider. Make sure you discuss any questions you have with your health care provider.    Emergency Department Resource Guide 1) Find a Doctor and Pay Out of Pocket Although you won't have to find out who is covered by your insurance plan, it is a good idea to ask around and get recommendations. You will then need to call the office and see if the doctor you have chosen will accept you as a new patient and what types of options they offer for patients who are self-pay. Some doctors offer discounts or will set up payment plans for their patients who do not have insurance, but you will need to ask so you aren't surprised when you get to your appointment.  2) Contact Your Local Health Department Not all health departments have doctors that can see patients for sick visits, but many do, so it is worth a call to see if yours does. If you don't know where your local health department  is, you can check in your phone book. The CDC also has a tool to help you locate your state's health department, and many state websites also have listings of all of their local health departments.  3) Find a Walk-in Clinic If your illness is not likely to be very severe or complicated, you may want to try a walk in clinic. These are popping up all over the country in pharmacies, drugstores, and shopping centers. They're usually staffed by nurse practitioners or physician assistants that have been trained to treat common illnesses and complaints. They're usually fairly quick and inexpensive. However, if you have serious  medical issues or chronic medical problems, these are probably not your best option.  No Primary Care Doctor: - Call Health Connect at  [phone] - they can help you locate a primary care doctor that  accepts your insurance, provides certain services, etc. - Physician Referral Service- [phone]  Chronic Pain Problems: Organization         Address  Phone   Notes  [facility]  [phone] Patients need to be referred by their primary care doctor.   Medication Assistance: Organization         Address  Phone   Notes  [facility] Medication [facility] [address] [place] [place], [place] [address] [phone] --Must be a resident of [facility] -- Must have NO insurance coverage whatsoever (no Medicaid/ Medicare, etc.) -- The pt. MUST have a primary care doctor that directs their care regularly and follows them in the community   [facility]  [phone]   [employer]  [phone]    Agencies that provide inexpensive medical care: Organization         Address  Phone   Notes  [facility]  [phone]   [facility]    [phone]   [facility] [address] [place], [place] [address] [phone]   [facility] [address], [place] [phone]   [facility]anned Parenthood    [phone]   [facility]    [phone]   [facility]  [address], [place] Phone:  [phone], Fax:  [phone] Hours of Operation:  9 am - 6 pm, M-F.  Also accepts Medicaid/Medicare and self-pay.  [facility]  [address], [place] Phone: [phone], Fax: [phone]. Hours of Operation:  8:30 am - 5:30 pm, M-F.  Also accepts Medicaid and self-pay.  [facility] [place] [address] [place] Phone: [phone]   [facility] [address] Natasha Bence  [place], [place] [phone], Ext. [phone] Mondays & Thursdays: 7-9 AM.  First 15 patients are seen on a first come, first serve basis.    Medicaid-accepting [facility] Providers:  Organization         Address  Phone   Notes  [facility] [address] [place] [phone] Also accepts self-pay patients.  [facility] [address] Laurell Josephs [place], [place]  [phone]   [facility] [address], [place] [phone]   [facility] [address] [place] [phone]   Renaye Rakers [address], [place]   [phone] Only accepts [place] patients after they have their  name applied to their card.   Self-Pay (no insurance) in [facility]:  Organization         Address  Phone   Notes  Sickle Cell Patients, [facility] [address] [place], [place] [phone]   [facility] [address] [place], [place] [phone]   [facility] [place]  [address],  [phone]   [facility]/Dr. Osei-Bonsu  [address], [place] or [address], [place] [phone] Phone number for both [place] and [place] locations is the same.  [facility] [address] [place] [phone]   [facility] [address], [place] or [address] [phone]   [facility] [address] [place] [phone], phone; [phone], fax Sees patients 1st and 3rd Saturday of every month.  Must not qualify for public or private insurance (i.e. Medicaid, Medicare, [place] Health Choice, Veterans' Benefits)  Household income should be no more than 200% of the poverty level The clinic cannot treat you if you are pregnant or think you are pregnant  Sexually  transmitted diseases are not treated at the clinic.    Dental Care: Organization         Address  Phone  Notes  [facility] Department of [facility] [address] [place], [place] [phone] Accepts children up to age 3 who are enrolled in [place] or [place] Health Choice; pregnant women with a Medicaid card; and children who have applied for Medicaid or [place] Health Choice, but were declined, whose parents can pay a reduced fee at time of service.  [facility] of [facility]  [address], [place] [phone] Accepts children up to age 87 who are enrolled in [place] or [place] Health Choice; pregnant women with a Medicaid card; and children who have applied for Medicaid or [place] Health Choice, but were declined, whose parents can pay a reduced fee at time of service.  [facility]  [address] [place], [place] [phone] Patients are seen by appointment only. Walk-ins are not accepted. [facility] will see patients 67 years of age and older. Monday - Tuesday (8am-5pm) Most Wednesdays (8:30-5pm) $30 per visit, cash only  [facility] Adult Dental Access [facility]  [address], [facility] [phone] Patients are seen by appointment only. Walk-ins are not accepted. [facility] will see patients 64 years of age and older. One Wednesday Evening (Monthly: Volunteer Based).  $30 per visit, cash only  [employer]  [phone] for adults; Children under age 2, call Graduate Pediatric Dentistry at [phone]. Children aged 69-14, please call [phone] to request a pediatric application.  Dental services are provided in all areas of dental care including fillings, crowns and bridges, complete and partial dentures, implants, gum treatment, root canals, and extractions. Preventive care is also provided. Treatment is provided to both adults and children. Patients are  selected via a lottery and there is often a waiting list.   [facility] [address] [place]  [phone] [email or web address]   [facility] [address] [place], [place] [phone], Ext. [phone] Second and Fourth Thursday of each month, opens at 6:30 AM; Clinic ends at 9 AM.  Patients are seen on a  first-come first-served basis, and a limited number are seen during each clinic.   [facility]  [address], [place], [place] [phone]   Eligibility Requirements You must have lived in [place], [place], or [place] counties for at least the last three months.   You cannot be eligible for state or federal sponsored [place]healthcare insurance, including [employer]eterans [employer], [place], or [employer]icare.   You generally cannot be eligible for healthcare insurance through your employer.    How to apply: Eligibility screenings are held every Tuesday and Wednesday afternoon from 1:00 pm until 4:00 pm. You do not need an appointment for the interview!  [facility] [address], [place], [place] [phone]   [facility]  [phone]   [facility]  [phone]   [facility]  [phone]    Behavioral Health Resources in the Community: Intensive Outpatient Programs Organization         Address  Phone  Notes  [facility] [address], [place], [place] [phone]   [facility] [address], [place], [place] [phone]   ADS: Alcohol & Drug [facility] [address], [place], [place]  [phone]   [facility] [address],  [place], [place] [phone] or [phone]   Substance Abuse Resources Organization         Address  Phone  Notes  [facility]  [phone]   [facility]  [phone]   [facility]  [phone]   [facility]  [phone]     Residential & Outpatient Substance Abuse Program  [phone]   Psychological Services Organization         Address  Phone  Notes  [facility]  [phone]   [facility]  [phone]- [phone]   [facility] [address], [place] [phone] or [phone]    Mobile Crisis Teams Organization         Address  Phone  Notes  [facility], Mobile Crisis Care Unit  [phone]   [facility]  [address] [place], [place] [phone]   Doristine LocksSharon DeEsch [address] [place] [phone]    Self-Help/Support Groups Organization         Address  Phone             Notes  [facility] - variety of support groups  [phone] Call for more information  Narcotics Anonymous (NA), [facility] [address], [place]  2 meetings at this location   [facility] Organization         Address  Phone  Notes  [facility] [address],    [place]  [phone]   [facility]  [address], [place] [phone], [place], [place] [phone]   [facility] [address], [place] [phone] Admissions: 8am-3pm M-F  [facility] [address]    [place], [place] [phone]   [facility] [address], [place], [place] [phone]   [facility] [address]  [place], [place] [phone]   [facility] Intensive Outpatient [address], [place], [place] [phone]   [facility] [address]  [place], [place] [phone] or [phone]   [facility] ([facility]) [address] [place], [place] [phone]  Accepts Medicaid  [facility] [address]  [place] [phone] Substance Abuse/Addiction Treatment   [facility] Organization         Address  Phone  Notes  [facility]  [phone]   Angie Fava, PhD [address] [place], [place]   [phone] or [phone]   [facility]   [address] [place], [place] [phone]   [facility] [address] [place], [place] [phone] Insurance/Medicaid/sponsorship through [facility] and Families [address]                                    [place], [place] [phone] Therapy/tele-psych/case  [facility] [address], [place] [phone]    Dr. Lolly Mustache  [phone]   [facility]  [employer] [facility] 1) [address], [place] 2) [address] [place] 3)  [address], [place] [phone]  [phone]   [facility] Child Abuse Hotline [phone] or [phone] (After Hours)

## 2015-01-29 NOTE — ED Notes (Signed)
Pt states he ate at [employer] yesterday and throughout the night got a "splitting headache" along with vomiting.

## 2015-01-29 NOTE — ED Provider Notes (Signed)
CSN: [id number]     Arrival date & time 01/29/15  0500 History   First MD Initiated Contact with Patient 01/29/15 0600     Chief Complaint  Patient presents with  . Emesis  . Headache     (Consider location/radiation/quality/duration/timing/severity/associated sxs/prior Treatment) The history is provided by the patient.   Garrett Gonzales is a 24 y.o. male presenting with nausea, vomiting and now headache which woke him around 4 am.  He last ate at [employer] around 4 pm yesterday, although did not have an appetite, and within several hours started vomiting, first food, then clear liquid, denies hematemesis.  He reports around 8 episodes of emesis since symptoms started.  He woke around 4 at which time he had his last emesis at which time he noticed a severe frontal headache.  He denies abdominal pain, diarrhea, fevers, visual changes, focal weakness or dizziness but does feel fatigued. He endorses daily drinking but only had 1/3 beer and a wine cooler yesterday which he states is light for him.  He has had no medicines prior to arrival.  His girlfriend ate the same food at [employer] and denies symptoms.       History reviewed. No pertinent past medical history. Past Surgical History  Procedure Laterality Date  . Tonsillectomy     No family history on file. History  Substance Use Topics  . Smoking status: Current Every Day Smoker -- 0.50 packs/day    Types: Cigarettes  . Smokeless tobacco: Not on file  . Alcohol Use: Yes     Comment: 1/day    Review of Systems  Constitutional: Negative for fever and chills.  HENT: Negative for congestion and sore throat.   Eyes: Negative.   Respiratory: Negative for chest tightness and shortness of breath.   Cardiovascular: Negative for chest pain.  Gastrointestinal: Positive for nausea and vomiting. Negative for abdominal pain and diarrhea.  Genitourinary: Negative.   Musculoskeletal: Negative for joint swelling, arthralgias and neck  pain.  Skin: Negative.  Negative for rash and wound.  Neurological: Positive for headaches. Negative for dizziness, weakness, light-headedness and numbness.  Psychiatric/Behavioral: Negative.       Allergies  Review of patient's allergies indicates no known allergies.  Home Medications   Prior to Admission medications   Medication Sig Start Date End Date Taking? Authorizing Provider  HYDROcodone-acetaminophen (NORCO/VICODIN) 5-325 MG per tablet Take 1 tablet by mouth every 4 (four) hours as needed for moderate pain. 07/19/14   Devoria Albe, MD  mirtazapine (REMERON) 7.5 MG tablet Take 7.5 mg by mouth at bedtime.    Historical Provider, MD  oxyCODONE-acetaminophen (PERCOCET) 5-325 MG per tablet Take 1-2 tablets by mouth every 4 (four) hours as needed. 07/20/14   Toy Cookey, MD  sulfamethoxazole-trimethoprim (BACTRIM DS,SEPTRA DS) 800-160 MG per tablet Take 1 tablet by mouth 2 (two) times daily. 07/19/14   Devoria Albe, MD   BP 127/78 mmHg  Pulse 64  Temp(Src) 97.9 F (36.6 C) (Oral)  Resp 18  Ht  (1.88 m)  Wt 170 lb (77.111 kg)  BMI 21.82 kg/m2  SpO2 100% Physical Exam  Constitutional: He is oriented to person, place, and time. He appears well-developed and well-nourished. No distress.  HENT:  Head: Normocephalic and atraumatic.  Mouth/Throat: Oropharynx is clear and moist.  Eyes: EOM are normal. Pupils are equal, round, and reactive to light.  Neck: Normal range of motion. Neck supple.  Cardiovascular: Normal rate and normal heart sounds.   Pulmonary/Chest: Effort normal and  breath sounds normal.  Abdominal: Soft. He exhibits no distension. There is tenderness. There is no rebound and no guarding.  Palpation epigastric area increases nausea, patient denies pain.  Musculoskeletal: Normal range of motion.  Lymphadenopathy:    He has no cervical adenopathy.  Neurological: He is alert and oriented to person, place, and time. He has normal strength. No sensory deficit. Gait  normal. GCS eye subscore is 4. GCS verbal subscore is 5. GCS motor subscore is 6.  Cranial nerves III-XII intact.  No pronator drift.  Skin: Skin is warm and dry. No rash noted.  Psychiatric: He has a normal mood and affect. His speech is normal and behavior is normal. Thought content normal. Cognition and memory are normal.  Nursing note and vitals reviewed.   ED Course  Procedures (including critical care time) Labs Review Labs Reviewed  CBC WITH DIFFERENTIAL/PLATELET - Abnormal; Notable for the following:    Neutrophils Relative % 25 (*)    Neutro Abs 1.1 (*)    Lymphocytes Relative 54 (*)    Monocytes Relative 16 (*)    All other components within normal limits  COMPREHENSIVE METABOLIC PANEL  LIPASE, BLOOD    Imaging Review No results found.   EKG Interpretation None      MDM   Final diagnoses:  None    Patient was given IV fluids, check electrolytes plus LFTs and lipase.  He was given Zofran and Toradol IV for symptom relief.  Labs reviewed and unremarkable.  He was given Zofran which resolved his nausea, he was able to tolerate by mouth fluids without increased symptoms.   headache resolving with Toradol.  Patient was prescribed Phenergan and ibuprofen for home use when necessary if symptoms persist.  Encouraged increased fluid intake.  Suspect this is viral etiology.  Doubt foodborne infection as he has no diarrhea, no fever and girlfriend who ate the same food is asymptomatic.  Encouraged recheck if symptoms are not improved or for worsening symptoms over the next 48 hours.    Burgess AmorJulie Luisa Louk, PA-C 01/29/15 [id number]  Loren Raceravid Yelverton, MD 01/31/15 [phone]

## 2015-08-21 ENCOUNTER — Encounter (HOSPITAL_COMMUNITY): Payer: Self-pay | Admitting: Emergency Medicine

## 2015-08-21 ENCOUNTER — Emergency Department (HOSPITAL_COMMUNITY)
Admission: EM | Admit: 2015-08-21 | Discharge: 2015-08-21 | Discharge status: Against Medical Advice | Payer: BLUE CROSS/BLUE SHIELD | Attending: Emergency Medicine | Admitting: Emergency Medicine

## 2015-08-21 DIAGNOSIS — F17210 Nicotine dependence, cigarettes, uncomplicated: Secondary | ICD-10-CM | POA: Insufficient documentation

## 2015-08-21 DIAGNOSIS — R202 Paresthesia of skin: Secondary | ICD-10-CM | POA: Insufficient documentation

## 2015-08-21 DIAGNOSIS — R110 Nausea: Secondary | ICD-10-CM | POA: Insufficient documentation

## 2015-08-21 DIAGNOSIS — R42 Dizziness and giddiness: Secondary | ICD-10-CM | POA: Insufficient documentation

## 2015-08-21 NOTE — ED Notes (Signed)
Called pt to be roomed 2X, no response.

## 2015-08-21 NOTE — ED Notes (Signed)
Called for reassessment, no answer.

## 2015-08-21 NOTE — ED Notes (Signed)
Onset 3-4 days ago nausea with right side of face tingling sensation constant continued today. Alert answering and following commands appropriate, airway intact, states feels like a small lump in throat.

## 2015-08-22 ENCOUNTER — Emergency Department (HOSPITAL_COMMUNITY): Payer: BLUE CROSS/BLUE SHIELD

## 2015-08-22 ENCOUNTER — Emergency Department (HOSPITAL_COMMUNITY)
Admission: EM | Admit: 2015-08-22 | Discharge: 2015-08-22 | DRG: 149 | Disposition: A | Discharge status: Home / Self Care | Payer: BLUE CROSS/BLUE SHIELD | Attending: Emergency Medicine | Admitting: Emergency Medicine

## 2015-08-22 ENCOUNTER — Encounter (HOSPITAL_COMMUNITY): Payer: Self-pay | Admitting: Emergency Medicine

## 2015-08-22 DIAGNOSIS — N390 Urinary tract infection, site not specified: Secondary | ICD-10-CM | POA: Diagnosis present

## 2015-08-22 DIAGNOSIS — R42 Dizziness and giddiness: Secondary | ICD-10-CM | POA: Diagnosis present

## 2015-08-22 DIAGNOSIS — R29810 Facial weakness: Secondary | ICD-10-CM | POA: Diagnosis not present

## 2015-08-22 DIAGNOSIS — F17210 Nicotine dependence, cigarettes, uncomplicated: Secondary | ICD-10-CM | POA: Diagnosis present

## 2015-08-22 DIAGNOSIS — R200 Anesthesia of skin: Secondary | ICD-10-CM | POA: Diagnosis not present

## 2015-08-22 LAB — CBC
HCT: 42.4 % (ref 39.0–52.0)
Hemoglobin: 14.6 g/dL (ref 13.0–17.0)
MCH: 29.9 pg (ref 26.0–34.0)
MCHC: 34.4 g/dL (ref 30.0–36.0)
MCV: 86.7 fL (ref 78.0–100.0)
PLATELETS: 254 10*3/uL (ref 150–400)
RBC: 4.89 MIL/uL (ref 4.22–5.81)
RDW: 12.8 % (ref 11.5–15.5)
WBC: 6.1 10*3/uL (ref 4.0–10.5)

## 2015-08-22 LAB — COMPREHENSIVE METABOLIC PANEL
ALT: 14 U/L — AB (ref 17–63)
AST: 25 U/L (ref 15–41)
Albumin: 3.7 g/dL (ref 3.5–5.0)
Alkaline Phosphatase: 47 U/L (ref 38–126)
Anion gap: 13 (ref 5–15)
BILIRUBIN TOTAL: 0.5 mg/dL (ref 0.3–1.2)
BUN: 11 mg/dL (ref 6–20)
CO2: 25 mmol/L (ref 22–32)
CREATININE: 1.11 mg/dL (ref 0.61–1.24)
Calcium: 9.2 mg/dL (ref 8.9–10.3)
Chloride: 103 mmol/L (ref 101–111)
GFR calc Af Amer: >60 mL/min (ref >60)
GLUCOSE: 115 mg/dL — AB (ref 65–99)
POTASSIUM: 3.8 mmol/L (ref 3.5–5.1)
SODIUM: 141 mmol/L (ref 135–145)
TOTAL PROTEIN: 5.8 g/dL — AB (ref 6.5–8.1)

## 2015-08-22 LAB — I-STAT CHEM 8, ED
BUN: 12 mg/dL (ref 6–20)
Calcium, Ion: 1.16 mmol/L (ref 1.12–1.23)
Chloride: 103 mmol/L (ref 101–111)
Creatinine, Ser: 1 mg/dL (ref 0.61–1.24)
Glucose, Bld: 112 mg/dL — ABNORMAL HIGH (ref 65–99)
HEMATOCRIT: 47 % (ref 39.0–52.0)
Hemoglobin: 16 g/dL (ref 13.0–17.0)
POTASSIUM: 3.6 mmol/L (ref 3.5–5.1)
Sodium: 142 mmol/L (ref 135–145)
TCO2: 25 mmol/L (ref 0–100)

## 2015-08-22 LAB — DIFFERENTIAL
BASOS PCT: 0 %
Basophils Absolute: 0 10*3/uL (ref 0.0–0.1)
Eosinophils Absolute: 0.2 10*3/uL (ref 0.0–0.7)
Eosinophils Relative: 4 %
Lymphocytes Relative: 49 %
Lymphs Abs: 3 10*3/uL (ref 0.7–4.0)
Monocytes Absolute: 1 10*3/uL (ref 0.1–1.0)
Monocytes Relative: 16 %
NEUTROS ABS: 1.9 10*3/uL (ref 1.7–7.7)
Neutrophils Relative %: 31 %

## 2015-08-22 LAB — APTT: APTT: 32 s (ref 24–37)

## 2015-08-22 LAB — I-STAT TROPONIN, ED: TROPONIN I, POC: 0.01 ng/mL (ref 0.00–0.08)

## 2015-08-22 LAB — PROTIME-INR
INR: 0.99 (ref 0.00–1.49)
PROTHROMBIN TIME: 13.3 s (ref 11.6–15.2)

## 2015-08-22 MED ORDER — MECLIZINE HCL 25 MG PO TABS: 25.0000 mg | ORAL_TABLET | Freq: Three times a day (TID) | ORAL | Status: DC | PRN

## 2015-08-22 MED ORDER — GADOBENATE DIMEGLUMINE 529 MG/ML IV SOLN
15.0000 mL | Freq: Once | INTRAVENOUS | Status: AC | PRN
  Administered 2015-08-22: 15 mL via INTRAVENOUS

## 2015-08-22 NOTE — Consult Note (Signed)
NEURO HOSPITALIST CONSULT NOTE   Referring physician: ED Reason for Consult: new onset right face paresthesias, unsteadiness, dizziness  HPI:                                                                                                                                          Garrett Gonzales is an 25 y.o. male with no significant past medical history who was in his usual state of health until 3-4 days ago when developed acute onset of " a spinning sensation when standing up and walking" along with imbalance and tingling/numbness right face. He tells me that he started having a mild, intermittent pain in the back of his head and had some nausea associated with it. Further, he noticed some difficulty swallowing water but this is much improved. Denies double vision, slurred speech, focal weakness, language or vision impairment. No recent fever, weight loss, foreign travel, or head trauma. No use of illicit drugs or alcohol consumption. His symptoms prompted a brain MRI with and without contrast that revealed a non enhancing, nonspecific 9 mm rounded lesion within the dorsal lateral right pontomedullary junction with mild local mass effect but no hydrocephalus.   History reviewed. No pertinent past medical history.  Past Surgical History  Procedure Laterality Date  . Tonsillectomy      No family history on file.  Family History: no MS, brain tumor, epilepsy, or aneurysm  Social History:  reports that he has been smoking Cigarettes.  He has been smoking about 0.50 packs per day. He does not have any smokeless tobacco history on file. He reports that he drinks alcohol. He reports that he uses illicit drugs (Marijuana).  No Known Allergies  MEDICATIONS:                                                                                                                     I have reviewed the patient's current medications.   ROS:  History obtained from chart review and the patient  General ROS: negative for - chills, fatigue, fever, night sweats, weight gain or weight loss Psychological ROS: negative for - behavioral disorder, hallucinations, memory difficulties, mood swings or suicidal ideation Ophthalmic ROS: negative for - blurry vision, double vision, eye pain or loss of vision ENT ROS: negative for - epistaxis, nasal discharge, oral lesions, sore throat, tinnitus or vertigo Allergy and Immunology ROS: negative for - hives or itchy/watery eyes Hematological and Lymphatic ROS: negative for - bleeding problems, bruising or swollen lymph nodes Endocrine ROS: negative for - galactorrhea, hair pattern changes, polydipsia/polyuria or temperature intolerance Respiratory ROS: negative for - cough, hemoptysis, shortness of breath or wheezing Cardiovascular ROS: negative for - chest pain, dyspnea on exertion, edema or irregular heartbeat Gastrointestinal ROS: negative for - abdominal pain, diarrhea, hematemesis, nausea/vomiting or stool incontinence Genito-Urinary ROS: negative for - dysuria, hematuria, incontinence or urinary frequency/urgency Musculoskeletal ROS: negative for - joint swelling or muscular weakness Neurological ROS: as noted in HPI Dermatological ROS: negative for rash and skin lesion changes  Physical exam:  Constitutional: well developed, pleasant male in no apparent distress. Blood pressure 144/98, pulse 68, temperature 99.1 F (37.3 C), resp. rate 15, SpO2 100 %. Eyes: no jaundice or exophthalmos.  Head: normocephalic. Neck: supple, no bruits, no JVD. Cardiac: no murmurs. Lungs: clear. Abdomen: soft, no tender, no mass. Extremities: no edema, clubbing, or cyanosis.  Skin: no rash  Neurologic Examination:                                                                                                       General: NAD Mental Status: Alert, oriented, thought content appropriate.  Speech fluent without evidence of aphasia.  Able to follow 3 step commands without difficulty. Cranial Nerves: II: Discs flat bilaterally; Visual fields grossly normal, pupils equal, round, reactive to light and accommodation III,IV, VI: ptosis not present, extra-ocular motions intact bilaterally V,VII: smile symmetric, facial light touch sensation slightly diminished in the left cheek. VIII: hearing normal bilaterally IX,X: uvula rises symmetrically XI: bilateral shoulder shrug XII: midline tongue extension without atrophy or fasciculations  Motor: Right : Upper extremity   5/5    Left:     Upper extremity   5/5  Lower extremity   5/5     Lower extremity   5/5 Tone and bulk:normal tone throughout; no atrophy noted Sensory: Pinprick and light touch intact throughout, bilaterally Deep Tendon Reflexes:  Right: Upper Extremity   Left: Upper extremity   biceps (C-5 to C-6) 2/4   biceps (C-5 to C-6) 2/4 tricep (C7) 2/4    triceps (C7) 2/4 Brachioradialis (C6) 2/4  Brachioradialis (C6) 2/4  Lower Extremity Lower Extremity  quadriceps (L-2 to L-4) 2/4   quadriceps (L-2 to L-4) 2/4 Achilles (S1) 2/4   Achilles (S1) 2/4  Plantars: Right: downgoing   Left: downgoing Cerebellar: normal finger-to-nose,  normal heel-to-shin test Gait:  No ataxia    No results found for: CHOL  Results for orders placed or performed during the hospital encounter of 08/22/15 (from the past  48 hour(s))  I-stat troponin, ED (not at [facility], [facility])     Status: None   Collection Time: 08/22/15  3:23 AM  Result Value Ref Range   Troponin i, poc 0.01 0.00 - 0.08 ng/mL   Comment 3            Comment: Due to the release kinetics of cTnI, a negative result within the first hours of the onset of symptoms does not rule out myocardial infarction with certainty. If myocardial infarction is still suspected, repeat the test at appropriate  intervals.   I-Stat Chem 8, ED  (not at [facility])     Status: Abnormal   Collection Time: 08/22/15  3:26 AM  Result Value Ref Range   Sodium 142 135 - 145 mmol/L   Potassium 3.6 3.5 - 5.1 mmol/L   Chloride 103 101 - 111 mmol/L   BUN 12 6 - 20 mg/dL   Creatinine, Ser 1.00 0.61 - 1.24 mg/dL   Glucose, Bld 112 (H) 65 - 99 mg/dL   Calcium, Ion 1.16 1.12 - 1.23 mmol/L   TCO2 25 0 - 100 mmol/L   Hemoglobin 16.0 13.0 - 17.0 g/dL   HCT 47.0 39.0 - 52.0 %  Protime-INR     Status: None   Collection Time: 08/22/15  3:42 AM  Result Value Ref Range   Prothrombin Time 13.3 11.6 - 15.2 seconds   INR 0.99 0.00 - 1.49  APTT     Status: None   Collection Time: 08/22/15  3:42 AM  Result Value Ref Range   aPTT 32 24 - 37 seconds  CBC     Status: None   Collection Time: 08/22/15  3:42 AM  Result Value Ref Range   WBC 6.1 4.0 - 10.5 K/uL   RBC 4.89 4.22 - 5.81 MIL/uL   Hemoglobin 14.6 13.0 - 17.0 g/dL   HCT 42.4 39.0 - 52.0 %   MCV 86.7 78.0 - 100.0 fL   MCH 29.9 26.0 - 34.0 pg   MCHC 34.4 30.0 - 36.0 g/dL   RDW 12.8 11.5 - 15.5 %   Platelets 254 150 - 400 K/uL  Differential     Status: None   Collection Time: 08/22/15  3:42 AM  Result Value Ref Range   Neutrophils Relative % 31 %   Neutro Abs 1.9 1.7 - 7.7 K/uL   Lymphocytes Relative 49 %   Lymphs Abs 3.0 0.7 - 4.0 K/uL   Monocytes Relative 16 %   Monocytes Absolute 1.0 0.1 - 1.0 K/uL   Eosinophils Relative 4 %   Eosinophils Absolute 0.2 0.0 - 0.7 K/uL   Basophils Relative 0 %   Basophils Absolute 0.0 0.0 - 0.1 K/uL  Comprehensive metabolic panel     Status: Abnormal   Collection Time: 08/22/15  3:42 AM  Result Value Ref Range   Sodium 141 135 - 145 mmol/L   Potassium 3.8 3.5 - 5.1 mmol/L   Chloride 103 101 - 111 mmol/L   CO2 25 22 - 32 mmol/L   Glucose, Bld 115 (H) 65 - 99 mg/dL   BUN 11 6 - 20 mg/dL   Creatinine, Ser 1.11 0.61 - 1.24 mg/dL   Calcium 9.2 8.9 - 10.3 mg/dL   Total Protein 5.8 (L) 6.5 - 8.1 g/dL   Albumin 3.7  3.5 - 5.0 g/dL   AST 25 15 - 41 U/L   ALT 14 (L) 17 - 63 U/L   Alkaline Phosphatase 47 38 - 126 U/L  Total Bilirubin 0.5 0.3 - 1.2 mg/dL   GFR calc non Af Amer >60 >60 mL/min   GFR calc Af Amer >60 >60 mL/min    Comment: (NOTE) The eGFR has been calculated using the CKD EPI equation. This calculation has not been validated in all clinical situations. eGFR's persistently <60 mL/min signify possible Chronic Kidney Disease.    Anion gap 13 5 - 15    Mr Kizzie Fantasia Contrast  08/22/2015  CLINICAL DATA:  25 year old with gradual onset of dizziness 3 days ago. Associated nausea with right-sided facial numbness and tingling. Off balance with weakness in extremities more than usual. Initial encounter. EXAM: MRI HEAD WITHOUT AND WITH CONTRAST TECHNIQUE: Multiplanar, multiecho pulse sequences of the brain and surrounding structures were obtained without and with intravenous contrast. CONTRAST:  80m MULTIHANCE GADOBENATE DIMEGLUMINE 529 MG/ML IV SOLN COMPARISON:  None. FINDINGS: 9 mm rounded area of altered signal intensity dorsal lateral right pontomedullary junction does not enhance. No associated gradient abnormality as may be expected with a cavernoma. Etiology indeterminate. It is possible this is related to demyelinating process (although no other white matter findings detected), low grade tumor, infectious process or inflammatory process. Subacute infarct felt be less likely consideration. This abnormality causes mild local mass effect and is in a critical region. If there is growth of this abnormality, this could narrow the outlet of the fourth ventricle. Currently only minimal narrowing right lateral aspect of the lower fourth ventricle. No hydrocephalus. Minimally low lying right cerebellar tonsil within the range normal limits. I would favor neurology consult with clinical and laboratory evaluation with close follow-up MR to help determine if there is a reversible component of this lesion prior to  assuming that this represents a low-grade tumor. No acute thrombotic infarct or intracranial hemorrhage. No hydrocephalus. Major intracranial vascular structures are patent. Mild exophthalmos. 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 Etiology indeterminate with above described considerations and recommended follow-up. Preliminary results discussed with Dr. AZenia Residesby Dr. MAlden Serverprior to contrast-enhanced imaging. Electronically Signed   By: SGenia DelM.D.   On: 08/22/2015 07:06   Assessment/Plan: Pleasant, healthy 25y/o male with new onset right face paresthesias, unsteadiness, dizziness for the past 3-4 days. His neuro-exam is unimpressive at this moment. 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  Location of this non specific isolated lesion can certainly explain patient symptomatology. Differential includes an isolated demyelinating plaque (but no other lesions identified on MRI), low grade tumor, or other inflammatory process. Patient is non toxic appearing, serologies are unremarkable, thus doubt an infectious etiology. A vascular etiology also unlikely. I have an ample conversation with patient and his companion regarding the potential etiologies of this lesion and I explained in detail to the patient that such abnormality in that particular location is very worrisome and requires close outpatient monitoring by neurology or neurosurgery to ensure stability. I did not recommend spinal tap or further neuroimaging of this isolated lesion today, but firmly advised the patient to return to the ED ASAP of there is any change in his symptoms. Outpatient neurology follow up in 2-3 weeks.  ODorian Pod MD 08/22/2015, 8:53 AM [facility]

## 2015-08-22 NOTE — ED Notes (Addendum)
C/o constant dizziness x 4 days.  Reports tingling and numbness to R side of mouth and R side of face x 2 days with R sided facial droop.  Denies dental pain to R side of mouth.  Denies fever/chills.

## 2015-08-22 NOTE — ED Provider Notes (Signed)
CSN: [id number]     Arrival date & time 08/22/15  0255 History  By signing my name below, I, Garrett Gonzales, attest that this documentation has been prepared under the direction and in the presence of Lorre Nick, MD. Electronically Signed: Octavia Gonzales, ED Scribe. 08/22/2015. 3:36 AM.    Chief Complaint  Patient presents with  . Dizziness  . Facial Droop     The history is provided by the patient. No language interpreter was used.   HPI Comments: Garrett Gonzales is a 25 y.o. male who presents to the Emergency Department complaining of constant, gradual worsening dizziness onset 3 days ago. He has been having associated nausea and right sided facial numbness and tingling. He reports he feels very off balance and weakness in his extremities more than usual. Pt states the dizziness is worse when he stands up. He reports his symptoms came out of nowhere with cold symptoms and notes that is all that he thought that it was. He does not take any medication on a regular basis.  History reviewed. No pertinent past medical history. Past Surgical History  Procedure Laterality Date  . Tonsillectomy     No family history on file. Social History  Substance Use Topics  . Smoking status: Current Every Day Smoker -- 0.50 packs/day    Types: Cigarettes  . Smokeless tobacco: None  . Alcohol Use: Yes     Comment: 1/day    Review of Systems  Gastrointestinal: Positive for nausea.  Neurological: Positive for dizziness and numbness. Negative for facial asymmetry.  All other systems reviewed and are negative.     Allergies  Review of patient's allergies indicates no known allergies.  Home Medications   Prior to Admission medications   Medication Sig Start Date End Date Taking? Authorizing Provider  HYDROcodone-acetaminophen (NORCO/VICODIN) 5-325 MG per tablet Take 1 tablet by mouth every 4 (four) hours as needed for moderate pain. 07/19/14   Devoria Albe, MD  ibuprofen (ADVIL,MOTRIN) 600 MG  tablet Take 1 tablet (600 mg total) by mouth every 6 (six) hours as needed. 01/29/15   Burgess Amor, PA-C  mirtazapine (REMERON) 7.5 MG tablet Take 7.5 mg by mouth at bedtime.    Historical Provider, MD  oxyCODONE-acetaminophen (PERCOCET) 5-325 MG per tablet Take 1-2 tablets by mouth every 4 (four) hours as needed. 07/20/14   Toy Cookey, MD  promethazine (PHENERGAN) 25 MG tablet Take 1 tablet (25 mg total) by mouth every 6 (six) hours as needed for nausea or vomiting. 01/29/15   Burgess Amor, PA-C  sulfamethoxazole-trimethoprim (BACTRIM DS,SEPTRA DS) 800-160 MG per tablet Take 1 tablet by mouth 2 (two) times daily. 07/19/14   Devoria Albe, MD   Triage vitals: BP 139/98 mmHg  Pulse 68  Temp(Src) 99.1 F (37.3 C)  Resp 20  SpO2 97% Physical Exam  Constitutional: He is oriented to person, place, and time. He appears well-developed and well-nourished.  Non-toxic appearance. No distress.  HENT:  Head: Normocephalic and atraumatic.  Eyes: Conjunctivae, EOM and lids are normal. Pupils are equal, round, and reactive to light.  Neck: Normal range of motion. Neck supple. No tracheal deviation present. No thyroid mass present.  Cardiovascular: Normal rate, regular rhythm and normal heart sounds.  Exam reveals no gallop.   No murmur heard. Pulmonary/Chest: Effort normal and breath sounds normal. No stridor. No respiratory distress. He has no decreased breath sounds. He has no wheezes. He has no rhonchi. He has no rales.  Abdominal: Soft. Normal appearance and bowel sounds  are normal. He exhibits no distension. There is no tenderness. There is no rebound and no CVA tenderness.  Musculoskeletal: Normal range of motion. He exhibits no edema or tenderness.  Neurological: He is alert and oriented to person, place, and time. He has normal strength. No cranial nerve deficit or sensory deficit. GCS eye subscore is 4. GCS verbal subscore is 5. GCS motor subscore is 6.  Gait normal no dysmetria on cerebellar testing,  no facial asymmetry   Skin: Skin is warm and dry. No abrasion and no rash noted.  Psychiatric: He has a normal mood and affect. His speech is normal and behavior is normal.  Nursing note and vitals reviewed.   ED Course  Procedures  DIAGNOSTIC STUDIES: Oxygen Saturation is 97% on RA, normal by my interpretation.  COORDINATION OF CARE:  3:36 AM Discussed treatment plan with pt at bedside and pt agreed to plan.  Labs Review Labs Reviewed  PROTIME-INR  APTT  CBC  DIFFERENTIAL  COMPREHENSIVE METABOLIC PANEL  I-STAT TROPOININ, ED  I-STAT CHEM 8, ED  CBG MONITORING, ED    Imaging Review No results found. I have personally reviewed and evaluated these images and lab results as part of my medical decision-making.   EKG Interpretation   Date/Time:  Saturday August 22 2015 03:06:12 EST Ventricular Rate:  63 PR Interval:  154 QRS Duration: 114 QT Interval:  384 QTC Calculation: 392 R Axis:   79 Text Interpretation:  Normal sinus rhythm Left ventricular hypertrophy  with repolarization abnormality Cannot rule out Inferior infarct , age  undetermined Abnormal ECG Confirmed by Freida Busman  MD, Rubi Tooley ([id number]) on  08/22/2015 3:37:34 AM      MDM   Final diagnoses:  None    I personally performed the services described in this documentation, which was scribed in my presence. The recorded information has been reviewed and is accurate.   Patient has an NIH stroke scale of 0. Patient underwent noncontrast brain MRI which show findings possibly consistent with MS but could be tumor. Radiology is requesting contrast and MRI of his brain is pending at this time   7:32 AM Spoke with neurology and will come and see the patient  Lorre Nick, MD 08/22/15 [id number]

## 2015-08-22 NOTE — Discharge Instructions (Signed)
Follow-up with neurology for further evaluation of the dizziness and MRI findings.

## 2015-08-22 NOTE — ED Notes (Addendum)
EKG read "ACUTE MI/STEMI."  Dr. Patria Mane at triage for initial eval.  Per Dr. Patria Mane- cancel CT at this time and continue rest of stroke protocol.

## 2015-08-24 ENCOUNTER — Emergency Department (HOSPITAL_COMMUNITY): Payer: BLUE CROSS/BLUE SHIELD

## 2015-08-24 ENCOUNTER — Inpatient Hospital Stay (HOSPITAL_COMMUNITY)
Admission: EM | Admit: 2015-08-24 | Discharge: 2015-08-26 | DRG: 060 | Disposition: A | Discharge status: Home / Self Care | Payer: BLUE CROSS/BLUE SHIELD | Attending: Internal Medicine | Admitting: Internal Medicine

## 2015-08-24 ENCOUNTER — Encounter (HOSPITAL_COMMUNITY): Payer: Self-pay | Admitting: Family Medicine

## 2015-08-24 DIAGNOSIS — G379 Demyelinating disease of central nervous system, unspecified: Principal | ICD-10-CM | POA: Diagnosis present

## 2015-08-24 DIAGNOSIS — G519 Disorder of facial nerve, unspecified: Secondary | ICD-10-CM

## 2015-08-24 DIAGNOSIS — R42 Dizziness and giddiness: Secondary | ICD-10-CM

## 2015-08-24 DIAGNOSIS — F17210 Nicotine dependence, cigarettes, uncomplicated: Secondary | ICD-10-CM | POA: Diagnosis present

## 2015-08-24 DIAGNOSIS — F1010 Alcohol abuse, uncomplicated: Secondary | ICD-10-CM | POA: Diagnosis present

## 2015-08-24 DIAGNOSIS — N390 Urinary tract infection, site not specified: Secondary | ICD-10-CM

## 2015-08-24 DIAGNOSIS — R930 Abnormal findings on diagnostic imaging of skull and head, not elsewhere classified: Secondary | ICD-10-CM

## 2015-08-24 DIAGNOSIS — F1910 Other psychoactive substance abuse, uncomplicated: Secondary | ICD-10-CM | POA: Diagnosis present

## 2015-08-24 DIAGNOSIS — R29810 Facial weakness: Secondary | ICD-10-CM | POA: Diagnosis present

## 2015-08-24 DIAGNOSIS — F1290 Cannabis use, unspecified, uncomplicated: Secondary | ICD-10-CM | POA: Diagnosis present

## 2015-08-24 DIAGNOSIS — G9389 Other specified disorders of brain: Secondary | ICD-10-CM

## 2015-08-24 DIAGNOSIS — R200 Anesthesia of skin: Secondary | ICD-10-CM

## 2015-08-24 DIAGNOSIS — H4921 Sixth [abducent] nerve palsy, right eye: Secondary | ICD-10-CM

## 2015-08-24 DIAGNOSIS — Z720 Tobacco use: Secondary | ICD-10-CM | POA: Diagnosis present

## 2015-08-24 HISTORY — DX: Alcohol abuse, uncomplicated: F10.10

## 2015-08-24 HISTORY — DX: Tobacco use: Z72.0

## 2015-08-24 HISTORY — DX: Other psychoactive substance abuse, uncomplicated: F19.10

## 2015-08-24 LAB — CBC
HEMATOCRIT: 45.9 % (ref 39.0–52.0)
Hemoglobin: 15.7 g/dL (ref 13.0–17.0)
MCH: 29.6 pg (ref 26.0–34.0)
MCHC: 34.2 g/dL (ref 30.0–36.0)
MCV: 86.6 fL (ref 78.0–100.0)
PLATELETS: 254 10*3/uL (ref 150–400)
RBC: 5.3 MIL/uL (ref 4.22–5.81)
RDW: 12.8 % (ref 11.5–15.5)
WBC: 5.4 10*3/uL (ref 4.0–10.5)

## 2015-08-24 LAB — CSF CELL COUNT WITH DIFFERENTIAL
EOS CSF: 0 % (ref 0–1)
EOS CSF: 0 % (ref 0–1)
RBC COUNT CSF: 2 /mm3 — AB
RBC Count, CSF: 179 /mm3 — ABNORMAL HIGH
TUBE #: 1
TUBE #: 4
WBC, CSF: 2 /mm3 (ref 0–5)
WBC, CSF: 3 /mm3 (ref 0–5)

## 2015-08-24 LAB — BASIC METABOLIC PANEL
Anion gap: 10 (ref 5–15)
BUN: 6 mg/dL (ref 6–20)
CHLORIDE: 104 mmol/L (ref 101–111)
CO2: 28 mmol/L (ref 22–32)
CREATININE: 0.86 mg/dL (ref 0.61–1.24)
Calcium: 9.7 mg/dL (ref 8.9–10.3)
GFR calc Af Amer: >60 mL/min (ref >60)
GFR calc non Af Amer: >60 mL/min (ref >60)
GLUCOSE: 109 mg/dL — AB (ref 65–99)
POTASSIUM: 3.9 mmol/L (ref 3.5–5.1)
SODIUM: 142 mmol/L (ref 135–145)

## 2015-08-24 LAB — GLUCOSE, CSF: Glucose, CSF: 68 mg/dL (ref 40–70)

## 2015-08-24 LAB — PROTEIN, CSF: Total  Protein, CSF: 16 mg/dL (ref 15–45)

## 2015-08-24 MED ORDER — STROKE: EARLY STAGES OF RECOVERY BOOK
Freq: Once | Status: AC
  Administered 2015-08-25: 01:00:00
  Filled 2015-08-24: qty 1

## 2015-08-24 MED ORDER — ONDANSETRON HCL 4 MG/2ML IJ SOLN: 4.0000 mg | Freq: Once | INTRAMUSCULAR | Status: DC

## 2015-08-24 MED ORDER — LORAZEPAM 2 MG/ML IJ SOLN: 0.0000 mg | Freq: Four times a day (QID) | INTRAMUSCULAR | Status: DC

## 2015-08-24 MED ORDER — FOLIC ACID 1 MG PO TABS
1.0000 mg | ORAL_TABLET | Freq: Every day | ORAL | Status: DC
  Administered 2015-08-25 – 2015-08-26 (×2): 1 mg via ORAL
  Filled 2015-08-24 (×2): qty 1

## 2015-08-24 MED ORDER — SODIUM CHLORIDE 0.9 % IV SOLN
250.0000 mg | Freq: Four times a day (QID) | INTRAVENOUS | Status: DC
  Administered 2015-08-24 – 2015-08-26 (×7): 250 mg via INTRAVENOUS
  Filled 2015-08-24 (×9): qty 2

## 2015-08-24 MED ORDER — LORAZEPAM 1 MG PO TABS: 1.0000 mg | ORAL_TABLET | Freq: Four times a day (QID) | ORAL | Status: DC | PRN

## 2015-08-24 MED ORDER — LIDOCAINE HCL 1 % IJ SOLN
10.0000 mL | Freq: Once | INTRAMUSCULAR | Status: DC
  Filled 2015-08-24: qty 10

## 2015-08-24 MED ORDER — VITAMIN B-1 100 MG PO TABS
100.0000 mg | ORAL_TABLET | Freq: Every day | ORAL | Status: DC
  Administered 2015-08-25 – 2015-08-26 (×2): 100 mg via ORAL
  Filled 2015-08-24 (×3): qty 1

## 2015-08-24 MED ORDER — ADULT MULTIVITAMIN W/MINERALS CH
1.0000 | ORAL_TABLET | Freq: Every day | ORAL | Status: DC
  Administered 2015-08-25 – 2015-08-26 (×2): 1 via ORAL
  Filled 2015-08-24 (×2): qty 1

## 2015-08-24 MED ORDER — FAMOTIDINE 20 MG PO TABS
20.0000 mg | ORAL_TABLET | Freq: Two times a day (BID) | ORAL | Status: DC
  Administered 2015-08-24 – 2015-08-26 (×4): 20 mg via ORAL
  Filled 2015-08-24 (×5): qty 1

## 2015-08-24 MED ORDER — LORAZEPAM 2 MG/ML IJ SOLN: 1.0000 mg | Freq: Four times a day (QID) | INTRAMUSCULAR | Status: DC | PRN

## 2015-08-24 MED ORDER — ONDANSETRON HCL 4 MG/2ML IJ SOLN
4.0000 mg | Freq: Once | INTRAMUSCULAR | Status: AC
  Administered 2015-08-24: 4 mg via INTRAVENOUS
  Filled 2015-08-24: qty 2

## 2015-08-24 MED ORDER — GADOBENATE DIMEGLUMINE 529 MG/ML IV SOLN
15.0000 mL | Freq: Once | INTRAVENOUS | Status: AC | PRN
  Administered 2015-08-24: 15 mL via INTRAVENOUS

## 2015-08-24 MED ORDER — BACLOFEN 10 MG PO TABS
10.0000 mg | ORAL_TABLET | Freq: Every day | ORAL | Status: DC
  Administered 2015-08-24 – 2015-08-25 (×2): 10 mg via ORAL
  Filled 2015-08-24 (×2): qty 1

## 2015-08-24 MED ORDER — SODIUM CHLORIDE 0.9 % IV SOLN
INTRAVENOUS | Status: DC
  Administered 2015-08-25: 01:00:00 via INTRAVENOUS

## 2015-08-24 MED ORDER — THIAMINE HCL 100 MG/ML IJ SOLN
100.0000 mg | Freq: Every day | INTRAMUSCULAR | Status: DC
  Filled 2015-08-24 (×2): qty 2

## 2015-08-24 MED ORDER — LIDOCAINE HCL 1 % IJ SOLN
20.0000 mL | Freq: Once | INTRAMUSCULAR | Status: AC
  Administered 2015-08-24: 20 mL
  Filled 2015-08-24: qty 20

## 2015-08-24 MED ORDER — LORAZEPAM 2 MG/ML IJ SOLN: 0.0000 mg | Freq: Two times a day (BID) | INTRAMUSCULAR | Status: DC

## 2015-08-24 MED ORDER — MECLIZINE HCL 12.5 MG PO TABS
25.0000 mg | ORAL_TABLET | Freq: Three times a day (TID) | ORAL | Status: DC | PRN
  Administered 2015-08-25: 25 mg via ORAL
  Filled 2015-08-24: qty 2

## 2015-08-24 MED ORDER — ONDANSETRON HCL 4 MG/2ML IJ SOLN
4.0000 mg | Freq: Three times a day (TID) | INTRAMUSCULAR | Status: DC | PRN
  Administered 2015-08-25: 4 mg via INTRAVENOUS
  Filled 2015-08-24: qty 2

## 2015-08-24 MED ORDER — SENNOSIDES-DOCUSATE SODIUM 8.6-50 MG PO TABS: 1.0000 | ORAL_TABLET | Freq: Every evening | ORAL | Status: DC | PRN

## 2015-08-24 MED ORDER — NICOTINE 21 MG/24HR TD PT24
21.0000 mg | MEDICATED_PATCH | Freq: Every day | TRANSDERMAL | Status: DC
  Administered 2015-08-26: 21 mg via TRANSDERMAL
  Filled 2015-08-24 (×2): qty 1

## 2015-08-24 NOTE — ED Provider Notes (Signed)
CSN: [id number]     Arrival date & time 08/24/15  1338 History   First MD Initiated Contact with Patient 08/24/15 1613     Chief Complaint  Patient presents with  . Nausea  . Dizziness  . Numbness     HPI  Patient presents for evaluation of worsening symptoms from an ER visit 2 days ago.  She describes 5-6 days of symptoms. Started with some dizziness described as spinning. Felt like his right lip was tingling. Seen and evaluated here 2 days ago, Jan 21st. Had MRI and neurological consult. 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 No hydrocephalus., Minimal local mass effect. Was advised to have close neurology follow-up. Discharged on Antivert. His dizziness has progressed. He states that 20 walks he feels like things are moving in and still shifting around. Has had some difficulty with swallowing. Thinks he has to "focus more" for food to go down. His right lower face has developed weakness as well noticeable to himself, as well as his girlfriend.    History reviewed. No pertinent past medical history. Past Surgical History  Procedure Laterality Date  . Tonsillectomy     History reviewed. No pertinent family history. Social History  Substance Use Topics  . Smoking status: Current Every Day Smoker -- 0.50 packs/day    Types: Cigarettes  . Smokeless tobacco: None  . Alcohol Use: Yes     Comment: 1/day    Review of Systems  Constitutional: Negative for fever, chills, diaphoresis, appetite change and fatigue.  HENT: Negative for mouth sores, sore throat and trouble swallowing.   Eyes: Negative for visual disturbance.  Respiratory: Negative for cough, chest tightness, shortness of breath and wheezing.   Cardiovascular: Negative for chest pain.  Gastrointestinal: Negative for nausea, vomiting, abdominal pain, diarrhea and abdominal distention.  Endocrine: Negative for polydipsia, polyphagia and polyuria.  Genitourinary: Negative for dysuria, frequency and hematuria.   Musculoskeletal: Negative for gait problem.  Skin: Negative for color change, pallor and rash.  Neurological: Positive for dizziness, weakness and numbness. Negative for syncope, light-headedness and headaches.       Right facial weakness. Right lip tingling. Dizziness described as "things tilting around".  Hematological: Does not bruise/bleed easily.  Psychiatric/Behavioral: Negative for behavioral problems and confusion.      Allergies  Review of patient's allergies indicates no known allergies.  Home Medications   Prior to Admission medications   Medication Sig Start Date End Date Taking? Authorizing Provider  meclizine (ANTIVERT) 25 MG tablet Take 1 tablet (25 mg total) by mouth 3 (three) times daily as needed for dizziness. 08/22/15  Yes Benjiman Core, MD  promethazine (PHENERGAN) 25 MG tablet Take 1 tablet (25 mg total) by mouth every 6 (six) hours as needed for nausea or vomiting. Patient not taking: Reported on 08/24/2015 01/29/15   Burgess Amor, PA-C   BP 142/111 mmHg  Pulse 71  Temp(Src) 98.3 F (36.8 C) (Oral)  Resp 18  SpO2 100% Physical Exam  Constitutional: He is oriented to person, place, and time. He appears well-developed and well-nourished. No distress.  HENT:  Head: Normocephalic.  Eyes: Conjunctivae are normal. Pupils are equal, round, and reactive to light. No scleral icterus.  Neck: Normal range of motion. Neck supple. No thyromegaly present.  Cardiovascular: Normal rate and regular rhythm.  Exam reveals no gallop and no friction rub.   No murmur heard. Pulmonary/Chest: Effort normal and breath sounds normal. No respiratory distress. He has no wheezes. He has no rales.  Abdominal: Soft. Bowel sounds are normal. He exhibits no distension. There is no tenderness. There is no rebound.  Musculoskeletal: Normal range of motion.  Neurological: He is alert and oriented to person, place, and time.  Normal vision. Normal EOM movements. Reports decreased right V2  and V3 sensation. Has right upper and lower facial weakness consistent with peripheral seventh nerve. Symmetric palate rise. Normal shoulder shrug. No pronator drift. Normal gait.  Skin: Skin is warm and dry. No rash noted.  Psychiatric: He has a normal mood and affect. His behavior is normal.    ED Course  .Lumbar Puncture Date/Time: 08/24/2015 10:51 PM Performed by: Rolland Porter Authorized by: Rolland Porter Consent: Verbal consent obtained. Written consent obtained. Risks and benefits: risks, benefits and alternatives were discussed Consent given by: patient Patient understanding: patient states understanding of the procedure being performed Patient consent: the patient's understanding of the procedure matches consent given Procedure consent: procedure consent matches procedure scheduled Relevant documents: relevant documents present and verified Test results: test results available and properly labeled Site marked: the operative site was not marked Imaging studies: imaging studies not available Patient identity confirmed: verbally with patient Time out: Immediately prior to procedure a "time out" was called to verify the correct patient, procedure, equipment, support staff and site/side marked as required. Indications: Possible multiple sclerosis. Anesthesia: local infiltration Local anesthetic: lidocaine 1% without epinephrine Patient sedated: no Preparation: Patient was prepped and draped in the usual sterile fashion. Lumbar space: L3-L4 interspace Patient's position: left lateral decubitus Needle gauge: 22 Needle type: diamond point Needle length: 3.5 in Number of attempts: 2 Opening pressure: 15 cm H2O Fluid appearance: clear Tubes of fluid: 4 Total volume: 6 ml Patient tolerance: Patient tolerated the procedure well with no immediate complications   (including critical care time) Labs Review Labs Reviewed  BASIC METABOLIC PANEL - Abnormal; Notable for the following:     Glucose, Bld 109 (*)    All other components within normal limits  CBC  URINALYSIS, ROUTINE W REFLEX MICROSCOPIC (NOT AT [facility])  CBG MONITORING, ED    Imaging Review No results found. I have personally reviewed and evaluated these images and lab results as part of my medical decision-making.   EKG Interpretation None      MDM   Final diagnoses:  None    Discussed with Dr. Lavon Paganini of neurology. Patient will be seen in the emergency room.  22:51:  LP completed. OP: 15cm H2O      Rolland Porter, MD 08/24/15 2252

## 2015-08-24 NOTE — Consult Note (Signed)
Requesting Physician: Dr.  Rolland Porter    Reason fo consultation:  Worsening right facial weakness  HPI:                                                                                                                                         Garrett Gonzales is an 25 y.o. male patient who presented to the ER 2 days ago with dizziness, vertigo symptoms. 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 He was advised to follow up with outpatient neurology and discharged from the ER. He came back to the ER today due to worsening right facial weakness of the past couple of days. Also reports having chewing difficulty and some mild swallowing problems as well. Continues to have intermittent vertigo symptoms. Also reports mild binocular diplopia with right gaze. Denies any weakness or numbness in arms or legs. No bowel or bladder problems.   History reviewed. No pertinent past medical history.  Past Surgical History  Procedure Laterality Date  . Tonsillectomy      History reviewed. No pertinent family history.   Denies any family history of neurological conditions or multiple sclerosis  Social History:  reports that he has been smoking Cigarettes.  He has been smoking about 0.50 packs per day. He does not have any smokeless tobacco history on file. He reports that he drinks alcohol. He reports that he uses illicit drugs (Marijuana).  Allergies: No Known Allergies  Medications:                                                                                                                         Current facility-administered medications:  .  baclofen (LIORESAL) tablet 10 mg, 10 mg, Oral, QHS, Leslee Haueter Daniel Nones, MD .  famotidine (PEPCID) tablet 20 mg, 20 mg, Oral, BID, Jarious Lyon Daniel Nones, MD .  lidocaine (XYLOCAINE) 1 % (with pres) injection 10 mL, 10 mL,  Infiltration, Once, Silas Flood, MD .  methylPREDNISolone sodium succinate (SOLU-MEDROL) 250 mg in sodium chloride 0.9 % 50 mL IVPB, 250 mg, Intravenous, Q6H, Wylma Tatem Daniel Nones, MD  Current outpatient prescriptions:  .  meclizine (ANTIVERT) 25 MG tablet, Take 1 tablet (25 mg total) by mouth 3 (three) times daily as needed for dizziness., Disp: 10  tablet, Rfl: 0 .  promethazine (PHENERGAN) 25 MG tablet, Take 1 tablet (25 mg total) by mouth every 6 (six) hours as needed for nausea or vomiting. (Patient not taking: Reported on 08/24/2015), Disp: 30 tablet, Rfl: 0   ROS:                                                                                                                                       History obtained from the patient  General ROS: negative for - chills, fatigue, fever, night sweats, weight gain or weight loss Psychological ROS: negative for - behavioral disorder, hallucinations, memory difficulties, mood swings or suicidal ideation Ophthalmic ROS: negative for - blurry vision, double vision, eye pain or loss of vision ENT ROS: negative for - epistaxis, nasal discharge, oral lesions, sore throat, tinnitus or vertigo Allergy and Immunology ROS: negative for - hives or itchy/watery eyes Hematological and Lymphatic ROS: negative for - bleeding problems, bruising or swollen lymph nodes Endocrine ROS: negative for - galactorrhea, hair pattern changes, polydipsia/polyuria or temperature intolerance Respiratory ROS: negative for - cough, hemoptysis, shortness of breath or wheezing Cardiovascular ROS: negative for - chest pain, dyspnea on exertion, edema or irregular heartbeat Gastrointestinal ROS: negative for - abdominal pain, diarrhea, hematemesis, nausea/vomiting or stool incontinence Genito-Urinary ROS: negative for - dysuria, hematuria, incontinence or urinary frequency/urgency Musculoskeletal ROS: negative for - joint swelling or muscular weakness Neurological ROS: as  noted in HPI Dermatological ROS: negative for rash and skin lesion changes  Neurologic Examination:                                                                                                      Blood pressure 151/98, pulse 70, temperature 98.3 F (36.8 C), temperature source Oral, resp. rate 18, SpO2 100 %.  Evaluation of higher integrative functions including: Level of alertness: Alert,  Oriented to time, place and person Recent and remote memory - intact   Attention span and concentration  - intact   Speech: fluent, no evidence of dysarthria or aphasia noted.  Test the following cranial nerves: Moderate right low motor neuron facial palsy of right seventh cranial nerve pulse is noted in addition to mild right lateral rectus weakness due to a right sixth cranial nerve palsy. Otherwise rest of the cranial nerves 2-5 and 8-12 were grossly intact Motor examination: Normal tone, bulk, full 5/5 motor strength in all 4 extremities Examination of sensation : Normal and symmetric sensation to pinprick in all 4 extremities and  on face Examination of deep tendon reflexes: 2+, normal and symmetric in all extremities, normal plantars bilaterally Test coordination: Normal finger nose testing, with no evidence of limb appendicular ataxia or abnormal involuntary movements or tremors noted.  Gait: Deferred   Lab Results: Basic Metabolic Panel:  Recent Labs Lab 08/22/15 0326 08/22/15 0342 08/24/15 1422  NA 142 141 142  K 3.6 3.8 3.9  CL 103 103 104  CO2  --  25 28  GLUCOSE 112* 115* 109*  BUN 12 11 6   CREATININE 1.00 1.11 0.86  CALCIUM  --  9.2 9.7    Liver Function Tests:  Recent Labs Lab 08/22/15 0342  AST 25  ALT 14*  ALKPHOS 47  BILITOT 0.5  PROT 5.8*  ALBUMIN 3.7   No results for input(s): LIPASE, AMYLASE in the last 168 hours. No results for input(s): AMMONIA in the last 168 hours.  CBC:  Recent Labs Lab 08/22/15 0326 08/22/15 0342 08/24/15 1422  WBC  --  6.1  5.4  NEUTROABS  --  1.9  --   HGB 16.0 14.6 15.7  HCT 47.0 42.4 45.9  MCV  --  86.7 86.6  PLT  --  254 254    Cardiac Enzymes: No results for input(s): CKTOTAL, CKMB, CKMBINDEX, TROPONINI in the last 168 hours.  Lipid Panel: No results for input(s): CHOL, TRIG, HDL, CHOLHDL, VLDL, LDLCALC in the last 168 hours.  CBG: No results for input(s): GLUCAP in the last 168 hours.  Microbiology: No results found for this or any previous visit.   Imaging: Mr Lodema Pilot Contrast  08/22/2015  CLINICAL DATA:  25 year old with gradual onset of dizziness 3 days ago. Associated nausea with right-sided facial numbness and tingling. Off balance with weakness in extremities more than usual. Initial encounter. EXAM: MRI HEAD WITHOUT AND WITH CONTRAST TECHNIQUE: Multiplanar, multiecho pulse sequences of the brain and surrounding structures were obtained without and with intravenous contrast. CONTRAST:  15mL MULTIHANCE GADOBENATE DIMEGLUMINE 529 MG/ML IV SOLN COMPARISON:  None. FINDINGS: 9 mm rounded area of altered signal intensity dorsal lateral right pontomedullary junction does not enhance. No associated gradient abnormality as may be expected with a cavernoma. Etiology indeterminate. It is possible this is related to demyelinating process (although no other white matter findings detected), low grade tumor, infectious process or inflammatory process. Subacute infarct felt be less likely consideration. This abnormality causes mild local mass effect and is in a critical region. If there is growth of this abnormality, this could narrow the outlet of the fourth ventricle. Currently only minimal narrowing right lateral aspect of the lower fourth ventricle. No hydrocephalus. Minimally low lying right cerebellar tonsil within the range normal limits. I would favor neurology consult with clinical and laboratory evaluation with close follow-up MR to help determine if there is a reversible component of this lesion prior  to assuming that this represents a low-grade tumor. No acute thrombotic infarct or intracranial hemorrhage. No hydrocephalus. Major intracranial vascular structures are patent. Mild exophthalmos. 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 Etiology indeterminate with above described considerations and recommended follow-up. Preliminary results discussed with Dr. Freida Busman by Dr. Gershon Mussel prior to contrast-enhanced imaging. Electronically Signed   By: Lacy Duverney M.D.   On: 08/22/2015 07:06    Assessment and plan:   Garrett Gonzales is an 25 y.o. male patient who presented worsening right 6th and 7th cranial nerve palsies , which I believe are secondary to the known nonenhancing demarcating  lesion in the right pontomedullary junction seen on the prior brain MRI study from 08/22/2015. There is a wide differential for this solitary intracranial lesion , although I would consider a primary CNS edema leading condition such as multiple sclerosis or ADEM high in the differential. Include other autoimmune conditions such as sarcoidosis, lupus in differential . Infectious or neoplastic etiology would be rare.     Since patient has been having worsening cranial neuropathies secondary to this lesion, recommend further neurodiagnostic workup with lumbar puncture for CSF studies to evaluate for the described differential diagnosis. Also recommend MRI of the cervical and thoracic spine with and without contrast to evaluate for any demyelinating lesions in the spinal cord. At the same time we will repeat a limited MRI of the brain with contrast see there is any new enhancing lesions in the brainstem contributing to this worsening symptoms noted today. For symptomatic management, recommend empiric high-dose IV Solu-Medrol 250 mg every 6 hours. Also start Pepcid 20 twice a day, and baclofen 10 mg bedtime for the jaw tightness is been expressing. Swallow evaluation  tomorrow morning due to mild subjective dysphagia symptoms from involvement of the brainstem. Patient will be admitted to the hospitalist service.  Discussed the neurological assessment in detail with the patient, reviewed the MRI images and explained the findings including the differential diagnosis, diagnostic testing planned and the empiric treatment with steroids. Patient understands the current assessment and agreed with to proceed with the lumbar puncture and start steroids.   case discussed with ER physician, Dr. Fayrene Fearing .    Neurology service will continue to follow up during her hospitalization. Please call for any further questions.

## 2015-08-24 NOTE — ED Notes (Signed)
Pt still off unit in MRI

## 2015-08-24 NOTE — ED Notes (Signed)
Dr. Fayrene Fearing at the bedside performing LP. Procedure explained to pt and to family by Dr. Fayrene Fearing, inform consent gotten.

## 2015-08-24 NOTE — ED Notes (Signed)
Pt still on MRI. 

## 2015-08-24 NOTE — ED Notes (Signed)
Hospitalist at the bedside 

## 2015-08-24 NOTE — ED Notes (Signed)
Pt here for dizziness, nausea and right sided facial numbness that has been going on since he left here on the 21st. sts worsening and he has been taking meclizine.

## 2015-08-25 ENCOUNTER — Ambulatory Visit: Payer: Self-pay | Admitting: Diagnostic Neuroimaging

## 2015-08-25 ENCOUNTER — Ambulatory Visit: Payer: Self-pay | Admitting: Neurology

## 2015-08-25 ENCOUNTER — Encounter (HOSPITAL_COMMUNITY): Payer: Self-pay | Admitting: *Deleted

## 2015-08-25 DIAGNOSIS — G379 Demyelinating disease of central nervous system, unspecified: Principal | ICD-10-CM

## 2015-08-25 DIAGNOSIS — R29810 Facial weakness: Secondary | ICD-10-CM

## 2015-08-25 LAB — C-REACTIVE PROTEIN: CRP: <0.5 mg/dL (ref <1.0)

## 2015-08-25 LAB — HIV ANTIBODY (ROUTINE TESTING W REFLEX): HIV Screen 4th Generation wRfx: NONREACTIVE

## 2015-08-25 LAB — COMPREHENSIVE METABOLIC PANEL
ALK PHOS: 55 U/L (ref 38–126)
ALT: 14 U/L — AB (ref 17–63)
AST: 21 U/L (ref 15–41)
Albumin: 4.1 g/dL (ref 3.5–5.0)
Anion gap: 13 (ref 5–15)
BUN: 6 mg/dL (ref 6–20)
CO2: 23 mmol/L (ref 22–32)
CREATININE: 1.02 mg/dL (ref 0.61–1.24)
Calcium: 9.9 mg/dL (ref 8.9–10.3)
Chloride: 104 mmol/L (ref 101–111)
Glucose, Bld: 153 mg/dL — ABNORMAL HIGH (ref 65–99)
Potassium: 4.4 mmol/L (ref 3.5–5.1)
Sodium: 140 mmol/L (ref 135–145)
Total Bilirubin: 0.7 mg/dL (ref 0.3–1.2)
Total Protein: 6.6 g/dL (ref 6.5–8.1)

## 2015-08-25 LAB — URINALYSIS, ROUTINE W REFLEX MICROSCOPIC
Bilirubin Urine: NEGATIVE
Glucose, UA: NEGATIVE mg/dL
HGB URINE DIPSTICK: NEGATIVE
Ketones, ur: NEGATIVE mg/dL
LEUKOCYTES UA: NEGATIVE
NITRITE: NEGATIVE
PROTEIN: NEGATIVE mg/dL
SPECIFIC GRAVITY, URINE: 1.017 (ref 1.005–1.030)
pH: 6 (ref 5.0–8.0)

## 2015-08-25 LAB — CBC WITH DIFFERENTIAL/PLATELET
Basophils Absolute: 0 10*3/uL (ref 0.0–0.1)
Basophils Relative: 0 %
EOS PCT: 0 %
Eosinophils Absolute: 0 10*3/uL (ref 0.0–0.7)
HCT: 45.5 % (ref 39.0–52.0)
HEMOGLOBIN: 15.7 g/dL (ref 13.0–17.0)
LYMPHS PCT: 14 %
Lymphs Abs: 0.5 10*3/uL — ABNORMAL LOW (ref 0.7–4.0)
MCH: 29.7 pg (ref 26.0–34.0)
MCHC: 34.5 g/dL (ref 30.0–36.0)
MCV: 86.2 fL (ref 78.0–100.0)
Monocytes Absolute: 0 10*3/uL — ABNORMAL LOW (ref 0.1–1.0)
Monocytes Relative: 1 %
NEUTROS ABS: 3.2 10*3/uL (ref 1.7–7.7)
Neutrophils Relative %: 85 %
PLATELETS: 270 10*3/uL (ref 150–400)
RBC: 5.28 MIL/uL (ref 4.22–5.81)
RDW: 12.7 % (ref 11.5–15.5)
WBC: 3.8 10*3/uL — AB (ref 4.0–10.5)

## 2015-08-25 LAB — GRAM STAIN: SPECIAL REQUESTS: NORMAL

## 2015-08-25 LAB — LIPID PANEL
CHOL/HDL RATIO: 3.1 ratio
Cholesterol: 169 mg/dL (ref 0–200)
HDL: 54 mg/dL (ref >40)
LDL CALC: 93 mg/dL (ref 0–99)
Triglycerides: 109 mg/dL (ref <150)
VLDL: 22 mg/dL (ref 0–40)

## 2015-08-25 LAB — DIC (DISSEMINATED INTRAVASCULAR COAGULATION)PANEL
D-Dimer, Quant: <0.27 ug/mL-FEU (ref 0.00–0.50)
Fibrinogen: 247 mg/dL (ref 204–475)
INR: 1.03 (ref 0.00–1.49)
Platelets: 284 10*3/uL (ref 150–400)
aPTT: 30 seconds (ref 24–37)

## 2015-08-25 LAB — DIC (DISSEMINATED INTRAVASCULAR COAGULATION) PANEL
PROTHROMBIN TIME: 13.7 s (ref 11.6–15.2)
SMEAR REVIEW: NONE SEEN

## 2015-08-25 LAB — PROTIME-INR

## 2015-08-25 LAB — ANTITHROMBIN III: AntiThromb III Func: 122 % — ABNORMAL HIGH (ref 75–120)

## 2015-08-25 LAB — RAPID URINE DRUG SCREEN, HOSP PERFORMED
Amphetamines: NOT DETECTED
BARBITURATES: NOT DETECTED
BENZODIAZEPINES: NOT DETECTED
Cocaine: NOT DETECTED
Opiates: NOT DETECTED
Tetrahydrocannabinol: POSITIVE — AB

## 2015-08-25 LAB — FIBRINOGEN: FIBRINOGEN: 267 mg/dL (ref 204–475)

## 2015-08-25 LAB — SAVE SMEAR

## 2015-08-25 LAB — APTT: aPTT: >200 seconds (ref 24–37)

## 2015-08-25 LAB — SEDIMENTATION RATE: SED RATE: 2 mm/h (ref 0–16)

## 2015-08-25 MED ORDER — OXYCODONE-ACETAMINOPHEN 5-325 MG PO TABS
1.0000 | ORAL_TABLET | ORAL | Status: DC | PRN
Start: 2015-08-25 — End: 2015-08-26

## 2015-08-25 MED ORDER — ACETAMINOPHEN 325 MG PO TABS
650.0000 mg | ORAL_TABLET | Freq: Four times a day (QID) | ORAL | Status: DC | PRN
  Administered 2015-08-25 (×2): 650 mg via ORAL
  Filled 2015-08-25 (×2): qty 2

## 2015-08-25 NOTE — Progress Notes (Signed)
   08/25/15 4144  Clinical Encounter Type  Visited With Patient  Visit Type Initial  Referral From [facility] responded to a request from the RN about an advanced directive. Chaplain met with patient, who seemed to think it was something he had to do. Chaplain explained that it was optional, and explained a bit of the paper work and the process to him. Patient requested that I leave the forms with him, and he will look them over. Chaplain support available as needed.  Jeri Lager, Chaplain 08/25/2015 9:40 AM

## 2015-08-25 NOTE — Progress Notes (Signed)
[facility]                                                                              Patient Demographics  Garrett Gonzales, is a 25 y.o. male, DOB - 03/15/1991, JYN:[id number]  Admit date - 08/24/2015   Admitting Physician Lorretta Harp, MD  Outpatient Primary MD for the patient is No PCP Per Patient  LOS - 1   Chief Complaint  Patient presents with  . Nausea  . Dizziness  . Numbness       Brief HPI   Per Dr. Clyde Lundborg on 1/23 Garrett Gonzales is a 25 y.o. male with PMH of tobacco abuse, drug abuse and alcohol abuse, presented with right facial weakness and dizziness for last 6-7 days. He has diplopia some times, but no hearing loss or weakness in extremities. No urinary incontinence or loss control for bowel movement.  Pt was seen in Ed on 08/22/15. He had MRI brain which demonstrated a non enhancing, nonspecific 9 mm rounded lesion within the dorsal lateral right pontomedullary junction with mild local mass effect, but no hydrocephalus. Neurology was consulted, Dr. Leroy Kennedy ET patient. Pt was discharge from ER and advised to f/u with outpatient neurology in 2-3 weeks. He came back to the ER today due to worsening right facial weakness. Also reports having chewing difficulty and some mild swallowing problems as well. He continues to have dizziness.  In ED, patient was found to have unremarkable MRI of the cervical and thoracic spine. MRI-brain with contrast showed 8 mm lesion at the pontomedullary junction, with new/ increased enhancement. WBC 6.1, temperature normal, no tachycardia, INR>10, PTT>200, electrolytes and renal function okay, pending urinalysis. Neurology, Dr. Lavon Paganini was consulted by EDP. Lumbar puncture was performed in EDP with pending CSF analysis. Patient was admitted for further workup.   Assessment & Plan    Demyelinating disease of central nervous system: - Neurology was consulted.  - MRI brain showed similar size of 8mm lesion at the pontomedullary  junction, DDx include multiple risk process, autoimmune conditions such as sarcoidosis, lupus, infectious, rarely neoplastic - LP done, negative for meningitis, myelin basic protein, oligoclonal bands pending -Currently on IV steroids per neurology, further plan deferred to neurology - Follow hypercoagulable workup - Continue Pepcid, baclofen   Elevated INR and PTT on admission labs : No signs of infection. No bleeding tendency. Was a lab error, appreciate Dr. Darrall Dears recommendations. DIC panel showed normal INR, PTT and fibrinogen.   Polysubstance abuse: including drug abuse, tobacco abuse and alcohol abuse. -UDS positive for THC -Nicotine patch -CIWA protocol  Code Status: Full code   Family Communication: Discussed in detail with the patient, all imaging results, lab results explained to the patient and family member in the room  Disposition Plan: Per neurology  Time Spent in minutes   25  minutes  Procedures  MRI brain, MRI C-spine, MRI thoracic spine  Consults   Neurology  DVT Prophylaxis SCDs  Medications  Scheduled Meds: . baclofen  10 mg Oral QHS  . famotidine  20 mg Oral BID  . folic acid  1 mg Oral Daily  . LORazepam  0-4 mg  Intravenous Q6H   Followed by  . [START ON 08/27/2015] LORazepam  0-4 mg Intravenous Q12H  . methylPREDNISolone (SOLU-MEDROL) injection  250 mg Intravenous Q6H  . multivitamin with minerals  1 tablet Oral Daily  . nicotine  21 mg Transdermal Daily  . thiamine  100 mg Oral Daily   Or  . thiamine  100 mg Intravenous Daily   Continuous Infusions: . sodium chloride 100 mL/hr at 08/25/15 0104   PRN Meds:.acetaminophen, LORazepam **OR** LORazepam, meclizine, ondansetron (ZOFRAN) IV, senna-docusate   Antibiotics   Anti-infectives    None        Subjective:   Garrett Gonzales was seen and examined today. Chewing is improving however still having right facial numbness and weakness.  dizziness is improving. Patient denies chest pain,  shortness of breath, abdominal pain, N/V/D/C, new weakness, numbess, tingling. No acute events overnight.    Objective:   Blood pressure 157/89, pulse 82, temperature 99 F (37.2 C), temperature source Oral, resp. rate 18, height 6\' 2"  (1.88 m), weight 74.39 kg (164 lb), SpO2 100 %.  Wt Readings from Last 3 Encounters:  08/25/15 74.39 kg (164 lb)  08/21/15 79.833 kg (176 lb)  01/29/15 77.111 kg (170 lb)     Intake/Output Summary (Last 24 hours) at 08/25/15 1137 Last data filed at 08/25/15 0611  Gross per 24 hour  Intake      0 ml  Output    800 ml  Net   -800 ml    Exam  General: Alert and oriented x 3, NAD  HEENT:  PERRLA, EOMI, Anicteric Sclera, mucous membranes moist.   Neck: Supple, no JVD, no masses  CVS: S1 S2 auscultated, no rubs, murmurs or gallops. Regular rate and rhythm.  Respiratory: Clear to auscultation bilaterally, no wheezing, rales or rhonchi  Abdomen: Soft, nontender, nondistended, + bowel sounds  Ext: no cyanosis clubbing or edema  Neuro: AAOx3, Cr N's II- XII. Strength 5/5 upper and lower extremities bilaterally, +Facial drooping   Skin: No rashes  Psych: Normal affect and demeanor, alert and oriented x3    Data Review   Micro Results Recent Results (from the past 240 hour(s))  Gram stain     Status: None   Collection Time: 08/24/15 10:29 PM  Result Value Ref Range Status   Specimen Description CSF  Final   Special Requests Normal  Final   Gram Stain   Final    CYTOSPIN SMEAR WBC PRESENT, PREDOMINANTLY MONONUCLEAR NO ORGANISMS SEEN    Report Status 08/25/2015 FINAL  Final    Radiology Reports Dg Chest 2 View  08/24/2015  CLINICAL DATA:  Dizziness, nausea and right facial numbness since 08/22/2015. Initial encounter. EXAM: CHEST  2 VIEW COMPARISON:  None. FINDINGS: The lungs are clear. Heart size is normal. There is no pneumothorax or pleural effusion. No focal bony abnormality. IMPRESSION: Negative chest. Electronically Signed   By:  Drusilla Kanner M.D.   On: 08/24/2015 18:59   Mr Laqueta Jean Contrast  08/24/2015  CLINICAL DATA:  Worsening right facial weakness for the past couple of days. Recently seen for dizziness/vertigo with abnormal brain MRI demonstrating a brainstem lesion. EXAM: MRI HEAD WITH CONTRAST TECHNIQUE: Multiplanar, multiecho pulse sequences of the brain and surrounding structures were obtained with intravenous contrast. COMPARISON:  Brain MRI 08/22/2015 CONTRAST:  15 mL MultiHance FINDINGS: A T2 hyperintense lesion is again seen in the right pontomedullary junction measuring approximately 8 mm, similar in size to the recent prior brain MRI. No significant enhancement  is seen on axial postcontrast images, however there is mild enhancement on the more delayed coronal and sagittal images, with enhancement greater peripherally than centrally on the coronal series (series 4, image 13). No abnormal enhancement is identified elsewhere in the brain. IMPRESSION: Similar size of 8 mm lesion at the pontomedullary junction, although now with new/ increased enhancement. The differential diagnosis remains as previously reported. An active demyelinating lesion remains a consideration, with other inflammatory or infectious processes as well as small neoplasm also possible. Electronically Signed   By: Sebastian Ache M.D.   On: 08/24/2015 21:30   Mr Laqueta Jean VO Contrast  08/22/2015  CLINICAL DATA:  25 year old with gradual onset of dizziness 3 days ago. Associated nausea with right-sided facial numbness and tingling. Off balance with weakness in extremities more than usual. Initial encounter. EXAM: MRI HEAD WITHOUT AND WITH CONTRAST TECHNIQUE: Multiplanar, multiecho pulse sequences of the brain and surrounding structures were obtained without and with intravenous contrast. CONTRAST:  15mL MULTIHANCE GADOBENATE DIMEGLUMINE 529 MG/ML IV SOLN COMPARISON:  None. FINDINGS: 9 mm rounded area of altered signal intensity dorsal lateral right  pontomedullary junction does not enhance. No associated gradient abnormality as may be expected with a cavernoma. Etiology indeterminate. It is possible this is related to demyelinating process (although no other white matter findings detected), low grade tumor, infectious process or inflammatory process. Subacute infarct felt be less likely consideration. This abnormality causes mild local mass effect and is in a critical region. If there is growth of this abnormality, this could narrow the outlet of the fourth ventricle. Currently only minimal narrowing right lateral aspect of the lower fourth ventricle. No hydrocephalus. Minimally low lying right cerebellar tonsil within the range normal limits. I would favor neurology consult with clinical and laboratory evaluation with close follow-up MR to help determine if there is a reversible component of this lesion prior to assuming that this represents a low-grade tumor. No acute thrombotic infarct or intracranial hemorrhage. No hydrocephalus. Major intracranial vascular structures are patent. Mild exophthalmos. 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 Etiology indeterminate with above described considerations and recommended follow-up. Preliminary results discussed with Dr. Freida Busman by Dr. Gershon Mussel prior to contrast-enhanced imaging. Electronically Signed   By: Lacy Duverney M.D.   On: 08/22/2015 07:06   Mr Cervical Spine W Wo Contrast  08/24/2015  CLINICAL DATA:  Worsening right facial weakness. Recently seen for dizziness and vertigo. Small nonenhancing lesion at the pontomedullary junction on recent brain MRI. EXAM: MRI CERVICAL SPINE WITHOUT AND WITH CONTRAST; MRI THORACIC SPINE WITHOUT AND WITH CONTRAST TECHNIQUE: Multiplanar and multiecho pulse sequences of the cervical spine, to include the craniocervical junction and cervicothoracic junction, were obtained according to standard protocol without and  with intravenous contrast.; Multiplanar and multiecho pulse sequences of the thoracic spine were obtained without and with intravenous contrast. CONTRAST:  15mL MULTIHANCE GADOBENATE DIMEGLUMINE 529 MG/ML IV SOLN COMPARISON:  Brain MRI 08/22/2015 FINDINGS: CERVICAL SPINE: An approximately 7 mm T2 hyperintense lesion is again seen in the right aspect of the pontomedullary junction. See concurrent postcontrast brain MRI as well as recent pre and postcontrast brain MRI. There is slight reversal of the normal cervical lordosis. There is no listhesis. Vertebral body heights are preserved. No vertebral marrow edema is seen. The cervical spinal cord is normal in caliber and signal. No abnormal enhancement is identified in the cervical spine. No disc herniation, spinal stenosis, there is minimal disc bulging at C4-5 without stenosis. Paraspinal  soft tissues are unremarkable. THORACIC SPINE: Vertebral alignment is normal. Vertebral body heights are preserved. No vertebral marrow edema is seen. Intervertebral discs are well hydrated and maintained in height. The thoracic spinal cord is normal in caliber and signal. No abnormal enhancement is identified. No disc herniation, spinal stenosis, or neural foraminal stenosis is seen. Paraspinal soft tissues are unremarkable. IMPRESSION: Unremarkable appearance of the cervical and thoracic spinal cord. Electronically Signed   By: Sebastian Ache M.D.   On: 08/24/2015 20:45   Mr Thoracic Spine W Wo Contrast  08/24/2015  CLINICAL DATA:  Worsening right facial weakness. Recently seen for dizziness and vertigo. Small nonenhancing lesion at the pontomedullary junction on recent brain MRI. EXAM: MRI CERVICAL SPINE WITHOUT AND WITH CONTRAST; MRI THORACIC SPINE WITHOUT AND WITH CONTRAST TECHNIQUE: Multiplanar and multiecho pulse sequences of the cervical spine, to include the craniocervical junction and cervicothoracic junction, were obtained according to standard protocol without and with  intravenous contrast.; Multiplanar and multiecho pulse sequences of the thoracic spine were obtained without and with intravenous contrast. CONTRAST:  15mL MULTIHANCE GADOBENATE DIMEGLUMINE 529 MG/ML IV SOLN COMPARISON:  Brain MRI 08/22/2015 FINDINGS: CERVICAL SPINE: An approximately 7 mm T2 hyperintense lesion is again seen in the right aspect of the pontomedullary junction. See concurrent postcontrast brain MRI as well as recent pre and postcontrast brain MRI. There is slight reversal of the normal cervical lordosis. There is no listhesis. Vertebral body heights are preserved. No vertebral marrow edema is seen. The cervical spinal cord is normal in caliber and signal. No abnormal enhancement is identified in the cervical spine. No disc herniation, spinal stenosis, there is minimal disc bulging at C4-5 without stenosis. Paraspinal soft tissues are unremarkable. THORACIC SPINE: Vertebral alignment is normal. Vertebral body heights are preserved. No vertebral marrow edema is seen. Intervertebral discs are well hydrated and maintained in height. The thoracic spinal cord is normal in caliber and signal. No abnormal enhancement is identified. No disc herniation, spinal stenosis, or neural foraminal stenosis is seen. Paraspinal soft tissues are unremarkable. IMPRESSION: Unremarkable appearance of the cervical and thoracic spinal cord. Electronically Signed   By: Sebastian Ache M.D.   On: 08/24/2015 20:45    CBC  Recent Labs Lab 08/22/15 0326 08/22/15 0342 08/24/15 1422 08/25/15 0237 08/25/15 0715  WBC  --  6.1 5.4  --  3.8*  HGB 16.0 14.6 15.7  --  15.7  HCT 47.0 42.4 45.9  --  45.5  PLT  --  254 254 284 270  MCV  --  86.7 86.6  --  86.2  MCH  --  29.9 29.6  --  29.7  MCHC  --  34.4 34.2  --  34.5  RDW  --  12.8 12.8  --  12.7  LYMPHSABS  --  3.0  --   --  0.5*  MONOABS  --  1.0  --   --  0.0*  EOSABS  --  0.2  --   --  0.0  BASOSABS  --  0.0  --   --  0.0    Chemistries   Recent Labs Lab  08/22/15 0326 08/22/15 0342 08/24/15 1422 08/25/15 0715  NA 142 141 142 140  K 3.6 3.8 3.9 4.4  CL 103 103 104 104  CO2  --  25 28 23   GLUCOSE 112* 115* 109* 153*  BUN 12 11 6 6   CREATININE 1.00 1.11 0.86 1.02  CALCIUM  --  9.2 9.7 9.9  AST  --  25  --  21  ALT  --  14*  --  14*  ALKPHOS  --  47  --  55  BILITOT  --  0.5  --  0.7   ------------------------------------------------------------------------------------------------------------------ estimated creatinine clearance is 119.5 mL/min (by C-G formula based on Cr of 1.02). ------------------------------------------------------------------------------------------------------------------ No results for input(s): HGBA1C in the last 72 hours. ------------------------------------------------------------------------------------------------------------------  Recent Labs  08/25/15 0135  CHOL 169  HDL 54  LDLCALC 93  TRIG 109  CHOLHDL 3.1   ------------------------------------------------------------------------------------------------------------------ No results for input(s): TSH, T4TOTAL, T3FREE, THYROIDAB in the last 72 hours.  Invalid input(s): FREET3 ------------------------------------------------------------------------------------------------------------------ No results for input(s): VITAMINB12, FOLATE, FERRITIN, TIBC, IRON, RETICCTPCT in the last 72 hours.  Coagulation profile  Recent Labs Lab 08/22/15 0342 08/24/15 2125 08/25/15 0237  INR 0.99 >10.00* 1.03     Recent Labs  08/25/15 0237  DDIMER <0.27    Cardiac Enzymes No results for input(s): CKMB, TROPONINI, MYOGLOBIN in the last 168 hours.  Invalid input(s): CK ------------------------------------------------------------------------------------------------------------------ Invalid input(s): POCBNP  No results for input(s): GLUCAP in the last 72 hours.   Garrett Gonzales M.D. [facility] 08/25/2015, 11:37 AM  Pager: [phone] Between  7am to 7pm - call Pager - [phone]  After 7pm go to [email or web address] - password [email or web address]  Call night coverage person covering after 7pm

## 2015-08-25 NOTE — Progress Notes (Signed)
SLP Cancellation Note  Patient Details Name: Massey Ruhland MRN: [id number] DOB: 03/18/1991   Cancelled treatment:       Reason Eval/Treat Not Completed: Patient at procedure or test/unavailable   Cobi Aldape,PAT, M.S., CCC-SLP 08/25/2015, 2:39 PM

## 2015-08-25 NOTE — Progress Notes (Signed)
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 The patient was seen in the ED 08/22/2015 with MRI showing this lesion and outpatient f/u was arranged. During that visit a PT and PTT was obtained which was normal.  The patient was admitted with similar/worsening Sx today. Repear brain MRI essentially unchanged and shows no evidence of a bleed. Cervical and thoracic spine MR were unremarkable. The patient had a LP 08/24/2015 w normal glc an protein, RBC 2 to 179 in tubes 1 and 4 respectively. No other evidence of bleeding. Per Dr Evorn Gong report, no fever and no brusing or bleeding Sxs or signs. Hb >14, normal platelet count.  The PT and PTT were elevated out of range when redrawn 08/24/2015. Dr Vicki Mallet tells me this was drawn from a peripheral vein.  The story doe not hang together--normal coags 08/22/2015, out of range 2 days later with no evidence of bleeding clinically including CBC, CNS MRIs and an invasive procedure. Note also absence of fever or signs ofi infection or sepsis. Suspect artifact.  Requested repeat via a DIC panel, which will also give [place] fibrinogen level and a blood film for review. If the repeat lab results are correct, we must have common pathway or distal involvement. Accordingly suggested factor X activity, as well as mixing PT and PTT studies. Dr Clyde Lundborg will call me w results.

## 2015-08-25 NOTE — H&P (Addendum)
[facility] History and Physical  Garrett Gonzales:[id number] DOB: [birth year] DOA: 08/24/2015  Referring physician: ED physician PCP: No PCP Per Patient  Specialists:   Chief Complaint: right facial weakness and dizziness  HPI: Garrett Gonzales is a 25 y.o. male with PMH of tobacco abuse, drug abuse and alcohol abuse, who presents with right facial weakness and dizziness.   Patient reports that he has been having right facial weakness and dizziness in the past 6-7 days. He has diplopia some times, but no hearing loss or weakness in extremities. No urinary incontinence or loss control for bowel movement. He states that his dizziness is aggravated by standing up, and alleviated by laying still.   Pt was seen in Ed on 08/22/15. He had MRI brain which demonstrated a non enhancing, nonspecific 9 mm rounded lesion within the dorsal lateral right pontomedullary junction with mild local mass effect,  but no hydrocephalus. Neurology was consulted, Dr. Armida Sans ET patient. Pt was discharge from ER and advised to f/u with outpatient neurology in 2-3 weeks. He came back to the ER today due to worsening right facial weakness.  Also reports having chewing difficulty and some mild swallowing problems as well. He continues to have dizziness. No chest pain, cough, shortness of breath abdominal pain, diarrhea, fever or chills.  In ED, patient was found to have unremarkable MRI of the cervical and thoracic spine. MRI-brain with contrast showed 8 mm lesion at the pontomedullary junction, with new/ increased enhancement. WBC 6.1, temperature normal, no tachycardia, INR>10, PTT>200, electrolytes and renal function okay, pending urinalysis. Neurology, Dr. Silverio Decamp was consulted by EDP. Lumbar puncture was performed in EDP with pending CSF analysis.  EKG: Independently reviewed.  QTC 399, right axis deviation  Where does patient live?   At home   Can patient participate in ADLs?  Yes  Review of Systems:    General: no fevers, chills, no changes in body weight,  Pulm: no dyspnea, coughing, wheezing CV: no chest pain, palpitations Abd: no nausea, vomiting, abdominal pain, diarrhea, constipation GU: no dysuria, burning on urination, increased urinary frequency, hematuria  Ext: no leg edema Neuro: has right facial weakness and dizziness, diplopia, no hearing loss Skin: no rash MSK: No muscle spasm, no deformity, no limitation of range of movement in spin Heme: No easy bruising.  Travel history: No recent long distant travel.  Allergy: No Known Allergies  Past Medical History  Diagnosis Date  . Tobacco abuse   . Drug abuse   . Alcohol abuse     Past Surgical History  Procedure Laterality Date  . Tonsillectomy      Social History:  reports that he has been smoking Cigarettes.  He has been smoking about 0.50 packs per day. He does not have any smokeless tobacco history on file. He reports that he drinks alcohol. He reports that he uses illicit drugs (Marijuana).  Family History:  Family History  Problem Relation Age of Onset  . Diabetes Mother      Prior to Admission medications   Medication Sig Start Date End Date Taking? Authorizing Provider  meclizine (ANTIVERT) 25 MG tablet Take 1 tablet (25 mg total) by mouth 3 (three) times daily as needed for dizziness. 08/22/15  Yes Davonna Belling, MD  promethazine (PHENERGAN) 25 MG tablet Take 1 tablet (25 mg total) by mouth every 6 (six) hours as needed for nausea or vomiting. Patient not taking: Reported on 08/24/2015 01/29/15   Evalee Jefferson, PA-C    Physical Exam: Danley Danker Vitals:  08/24/15 1400 08/24/15 1700 08/24/15 2133 08/24/15 2200  BP: 142/111 151/98 148/106 125/87  Pulse: 71 70 71 68  Temp: 98.3 F (36.8 C)  98.1 F (36.7 C)   TempSrc: Oral  Oral   Resp: 18  14   SpO2: 100% 100% 100% 100%   General: Not in acute distress HEENT:       Eyes: PERRL, EOMI, no scleral icterus.       ENT: No discharge from the ears and nose,  no pharynx injection, no tonsillar enlargement.        Neck: No JVD, no bruit, no mass felt. Heme: No neck lymph node enlargement. Cardiac: S1/S2, RRR, No murmurs, No gallops or rubs. Pulm: Good air movement bilaterally. No rales, wheezing, rhonchi or rubs. Abd: Soft, nondistended, nontender, no rebound pain, no organomegaly, BS present. Ext: No pitting leg edema bilaterally. 2+DP/PT pulse bilaterally. Musculoskeletal: No joint deformities, No joint redness or warmth, no limitation of ROM in spin. Skin: No rashes.  Neuro: Alert, oriented X3, cranial nerves II-XII grossly intact except for right facial droop, muscle strength 5/5 in all extremities, sensation to light touch intact. Knee reflex 1+ bilaterally. Negative Babinski's sign. Normal finger to nose test. Psych: Patient is not psychotic, no suicidal or hemocidal ideation.  Labs on Admission:  Basic Metabolic Panel:  Recent Labs Lab 08/22/15 0326 08/22/15 0342 08/24/15 1422  NA 142 141 142  K 3.6 3.8 3.9  CL 103 103 104  CO2  --  25 28  GLUCOSE 112* 115* 109*  BUN 12 11 6   CREATININE 1.00 1.11 0.86  CALCIUM  --  9.2 9.7   Liver Function Tests:  Recent Labs Lab 08/22/15 0342  AST 25  ALT 14*  ALKPHOS 47  BILITOT 0.5  PROT 5.8*  ALBUMIN 3.7   No results for input(s): LIPASE, AMYLASE in the last 168 hours. No results for input(s): AMMONIA in the last 168 hours. CBC:  Recent Labs Lab 08/22/15 0326 08/22/15 0342 08/24/15 1422  WBC  --  6.1 5.4  NEUTROABS  --  1.9  --   HGB 16.0 14.6 15.7  HCT 47.0 42.4 45.9  MCV  --  86.7 86.6  PLT  --  254 254   Cardiac Enzymes: No results for input(s): CKTOTAL, CKMB, CKMBINDEX, TROPONINI in the last 168 hours.  BNP (last 3 results) No results for input(s): BNP in the last 8760 hours.  ProBNP (last 3 results) No results for input(s): PROBNP in the last 8760 hours.  CBG: No results for input(s): GLUCAP in the last 168 hours.  Radiological Exams on Admission: Dg  Chest 2 View  08/24/2015  CLINICAL DATA:  Dizziness, nausea and right facial numbness since 08/22/2015. Initial encounter. EXAM: CHEST  2 VIEW COMPARISON:  None. FINDINGS: The lungs are clear. Heart size is normal. There is no pneumothorax or pleural effusion. No focal bony abnormality. IMPRESSION: Negative chest. Electronically Signed   By: Inge Rise M.D.   On: 08/24/2015 18:59   Mr Jeri Cos Contrast  08/24/2015  CLINICAL DATA:  Worsening right facial weakness for the past couple of days. Recently seen for dizziness/vertigo with abnormal brain MRI demonstrating a brainstem lesion. EXAM: MRI HEAD WITH CONTRAST TECHNIQUE: Multiplanar, multiecho pulse sequences of the brain and surrounding structures were obtained with intravenous contrast. COMPARISON:  Brain MRI 08/22/2015 CONTRAST:  15 mL MultiHance FINDINGS: A T2 hyperintense lesion is again seen in the right pontomedullary junction measuring approximately 8 mm, similar in size to the  recent prior brain MRI. No significant enhancement is seen on axial postcontrast images, however there is mild enhancement on the more delayed coronal and sagittal images, with enhancement greater peripherally than centrally on the coronal series (series 4, image 13). No abnormal enhancement is identified elsewhere in the brain. IMPRESSION: Similar size of 8 mm lesion at the pontomedullary junction, although now with new/ increased enhancement. The differential diagnosis remains as previously reported. An active demyelinating lesion remains a consideration, with other inflammatory or infectious processes as well as small neoplasm also possible. Electronically Signed   By: Logan Bores M.D.   On: 08/24/2015 21:30   Mr Cervical Spine W Wo Contrast  08/24/2015  CLINICAL DATA:  Worsening right facial weakness. Recently seen for dizziness and vertigo. Small nonenhancing lesion at the pontomedullary junction on recent brain MRI. EXAM: MRI CERVICAL SPINE WITHOUT AND WITH  CONTRAST; MRI THORACIC SPINE WITHOUT AND WITH CONTRAST TECHNIQUE: Multiplanar and multiecho pulse sequences of the cervical spine, to include the craniocervical junction and cervicothoracic junction, were obtained according to standard protocol without and with intravenous contrast.; Multiplanar and multiecho pulse sequences of the thoracic spine were obtained without and with intravenous contrast. CONTRAST:  98m MULTIHANCE GADOBENATE DIMEGLUMINE 529 MG/ML IV SOLN COMPARISON:  Brain MRI 08/22/2015 FINDINGS: CERVICAL SPINE: An approximately 7 mm T2 hyperintense lesion is again seen in the right aspect of the pontomedullary junction. See concurrent postcontrast brain MRI as well as recent pre and postcontrast brain MRI. There is slight reversal of the normal cervical lordosis. There is no listhesis. Vertebral body heights are preserved. No vertebral marrow edema is seen. The cervical spinal cord is normal in caliber and signal. No abnormal enhancement is identified in the cervical spine. No disc herniation, spinal stenosis, there is minimal disc bulging at C4-5 without stenosis. Paraspinal soft tissues are unremarkable. THORACIC SPINE: Vertebral alignment is normal. Vertebral body heights are preserved. No vertebral marrow edema is seen. Intervertebral discs are well hydrated and maintained in height. The thoracic spinal cord is normal in caliber and signal. No abnormal enhancement is identified. No disc herniation, spinal stenosis, or neural foraminal stenosis is seen. Paraspinal soft tissues are unremarkable. IMPRESSION: Unremarkable appearance of the cervical and thoracic spinal cord. Electronically Signed   By: ALogan BoresM.D.   On: 08/24/2015 20:45   Mr Thoracic Spine W Wo Contrast  08/24/2015  CLINICAL DATA:  Worsening right facial weakness. Recently seen for dizziness and vertigo. Small nonenhancing lesion at the pontomedullary junction on recent brain MRI. EXAM: MRI CERVICAL SPINE WITHOUT AND WITH  CONTRAST; MRI THORACIC SPINE WITHOUT AND WITH CONTRAST TECHNIQUE: Multiplanar and multiecho pulse sequences of the cervical spine, to include the craniocervical junction and cervicothoracic junction, were obtained according to standard protocol without and with intravenous contrast.; Multiplanar and multiecho pulse sequences of the thoracic spine were obtained without and with intravenous contrast. CONTRAST:  155mMULTIHANCE GADOBENATE DIMEGLUMINE 529 MG/ML IV SOLN COMPARISON:  Brain MRI 08/22/2015 FINDINGS: CERVICAL SPINE: An approximately 7 mm T2 hyperintense lesion is again seen in the right aspect of the pontomedullary junction. See concurrent postcontrast brain MRI as well as recent pre and postcontrast brain MRI. There is slight reversal of the normal cervical lordosis. There is no listhesis. Vertebral body heights are preserved. No vertebral marrow edema is seen. The cervical spinal cord is normal in caliber and signal. No abnormal enhancement is identified in the cervical spine. No disc herniation, spinal stenosis, there is minimal disc bulging at C4-5 without stenosis. Paraspinal  soft tissues are unremarkable. THORACIC SPINE: Vertebral alignment is normal. Vertebral body heights are preserved. No vertebral marrow edema is seen. Intervertebral discs are well hydrated and maintained in height. The thoracic spinal cord is normal in caliber and signal. No abnormal enhancement is identified. No disc herniation, spinal stenosis, or neural foraminal stenosis is seen. Paraspinal soft tissues are unremarkable. IMPRESSION: Unremarkable appearance of the cervical and thoracic spinal cord. Electronically Signed   By: Logan Bores M.D.   On: 08/24/2015 20:45    Assessment/Plan Principal Problem:   Demyelinating disease of central nervous system, unspecified (HCC) Active Problems:   Tobacco abuse   Drug abuse   Dizziness   Facial weakness   Alcohol abuse   Right facial numbness   Demyelinating disease of  central nervous system: Neurology was consulted. Dr. Silverio Decamp evaluated pt, and think pt's symptoms are secondary to the known nonenhancing demarcating lesion in the right pontomedullary junction seen on the prior brain MRI study from 08/22/2015. Per Dr. Silverio Decamp, DD is wide,  Including,  multiple sclerosis, autoimmune conditions such as sarcoidosis, lupus, but infectious or neoplastic etiology would be rare.  -will admit to tele bed -LP was done per Dr. Woodward Ku recommendations, pending CSF analysis -Empiric high-dose IV Solu-Medrol 250 mg every 6 hours; Pepcid 20 twice a day, and baclofen 10 mg bedtime for the jaw tightness per Dr. Silverio Decamp -also ordered hypercoagulable panel, ANA, ESR and CRP  Elevated INR and PTT: No signs of infection. No bleeding tendency. Etiology is not clear. Hematologist, Dr. Jana Hakim was consulted.  -Will get state peripheral smear, DIC panel, factor 10 activity, Factor 8 and 9 assay, mixed PT and mixed PTT study.  Addendum: DIC panel showed normal INR, PTT and fibrinogen, therefore did not call back hematologist  Polysubstance abuse:  including drug abuse, tobacco abuse and alcohol abuse. -did counseling about the importance of quitting substance use -Nicotine patch -CIWA protocol   DVT ppx: SCD (pt just had LP)  Code Status: Full code Family Communication: None at bed side.    Disposition Plan: Admit to inpatient   Date of Service 08/24/2015    Ivor Costa [facility] Pager [phone]  If 7PM-7AM, please contact night-coverage [email or web address] Password [facility] 08/24/2015, 11:41 PM

## 2015-08-25 NOTE — Progress Notes (Signed)
Interval history:                                                                                                                                       Garrett Gonzales is an 25 y.o. male patient who presented with worsening right sixth and seventh cranial nerve palsies. Repeat brain MRI showed contrast enhancement of the previously known demyelination lesion in the right pontomedullary junction. This highly resists suspicion for this being an acute demyelinating lesion patient has noticed significant improvement in the right facial weakness as well as the diplopia with right gaze after the 24 hours of IV steroids. Tolerating well, no side effects. No other new neurological symptoms MRI of the cervical and thoracic spine is unremarkable. CSF labs unremarkable so far. Oligoclonal bands, myelin basic protein, IgG index results still pending     Past Medical History  Diagnosis Date  . Tobacco abuse   . Drug abuse   . Alcohol abuse     Past Surgical History  Procedure Laterality Date  . Tonsillectomy      Family History  Problem Relation Age of Onset  . Diabetes Mother     Social History:  reports that he has been smoking Cigarettes.  He has a .25 pack-year smoking history. He has never used smokeless tobacco. He reports that he drinks alcohol. He reports that he uses illicit drugs (Marijuana).  Allergies: No Known Allergies  Medications:                                                                                                                         Current facility-administered medications:  .  0.9 %  sodium chloride infusion, , Intravenous, Continuous, Lorretta Harp, MD, Last Rate: 100 mL/hr at 08/25/15 0104 .  acetaminophen (TYLENOL) tablet 650 mg, 650 mg, Oral, Q6H PRN, Lorretta Harp, MD, 650 mg at 08/25/15 1038 .  baclofen (LIORESAL) tablet 10 mg, 10 mg, Oral, QHS, Mirjana Tarleton Daniel Nones, MD, 10 mg at 08/24/15 2323 .  famotidine (PEPCID) tablet 20 mg, 20 mg, Oral, BID, Swati Granberry  Daniel Nones, MD, 20 mg at 08/25/15 0941 .  folic acid (FOLVITE) tablet 1 mg, 1 mg, Oral, Daily, Lorretta Harp, MD, 1 mg at 08/25/15 0941 .  LORazepam (ATIVAN) injection 0-4 mg, 0-4 mg, Intravenous, Q6H, 0 mg at 08/25/15 0040 **FOLLOWED BY** [START ON 08/27/2015] LORazepam (ATIVAN) injection 0-4  mg, 0-4 mg, Intravenous, Q12H, Lorretta Harp, MD .  LORazepam (ATIVAN) tablet 1 mg, 1 mg, Oral, Q6H PRN **OR** LORazepam (ATIVAN) injection 1 mg, 1 mg, Intravenous, Q6H PRN, Lorretta Harp, MD .  meclizine (ANTIVERT) tablet 25 mg, 25 mg, Oral, TID PRN, Lorretta Harp, MD, 25 mg at 08/25/15 1459 .  methylPREDNISolone sodium succinate (SOLU-MEDROL) 250 mg in sodium chloride 0.9 % 50 mL IVPB, 250 mg, Intravenous, Q6H, Yelitza Reach Daniel Nones, MD, 250 mg at 08/25/15 1859 .  multivitamin with minerals tablet 1 tablet, 1 tablet, Oral, Daily, Lorretta Harp, MD, 1 tablet at 08/25/15 [phone] .  nicotine (NICODERM CQ - dosed in mg/24 hours) patch 21 mg, 21 mg, Transdermal, Daily, Lorretta Harp, MD, 21 mg at 08/25/15 0942 .  ondansetron (ZOFRAN) injection 4 mg, 4 mg, Intravenous, Q8H PRN, Lorretta Harp, MD, 4 mg at 08/25/15 1459 .  oxyCODONE-acetaminophen (PERCOCET/ROXICET) 5-325 MG per tablet 1-2 tablet, 1-2 tablet, Oral, Q4H PRN, Ripudeep K Rai, MD .  senna-docusate (Senokot-S) tablet 1 tablet, 1 tablet, Oral, QHS PRN, Lorretta Harp, MD .  thiamine (VITAMIN B-1) tablet 100 mg, 100 mg, Oral, Daily, 100 mg at 08/25/15 0942 **OR** thiamine (B-1) injection 100 mg, 100 mg, Intravenous, Daily, Lorretta Harp, MD   ROS:                                                                                                                                       History obtained from the patient  General ROS: negative for - chills, fatigue, fever, night sweats, weight gain or weight loss Psychological ROS: negative for - behavioral disorder, hallucinations, memory difficulties, mood swings or suicidal ideation Ophthalmic ROS: negative for - blurry vision,  double vision, eye pain or loss of vision ENT ROS: negative for - epistaxis, nasal discharge, oral lesions, sore throat, tinnitus or vertigo Allergy and Immunology ROS: negative for - hives or itchy/watery eyes Hematological and Lymphatic ROS: negative for - bleeding problems, bruising or swollen lymph nodes Endocrine ROS: negative for - galactorrhea, hair pattern changes, polydipsia/polyuria or temperature intolerance Respiratory ROS: negative for - cough, hemoptysis, shortness of breath or wheezing Cardiovascular ROS: negative for - chest pain, dyspnea on exertion, edema or irregular heartbeat Gastrointestinal ROS: negative for - abdominal pain, diarrhea, hematemesis, nausea/vomiting or stool incontinence Genito-Urinary ROS: negative for - dysuria, hematuria, incontinence or urinary frequency/urgency Musculoskeletal ROS: negative for - joint swelling or muscular weakness Neurological ROS: as noted in HPI Dermatological ROS: negative for rash and skin lesion changes  Neurologic Examination:  Blood pressure 139/86, pulse 74, temperature 98.7 F (37.1 C), temperature source Oral, resp. rate 20, height 6\' 2"  (1.88 m), weight 74.39 kg (164 lb), SpO2 100 %.  Evaluation of higher integrative functions including: Level of alertness: Alert,  Oriented to time, place and person Recent and remote memory - intact  Attention span and concentration - intact  Speech: fluent, no evidence of dysarthria or aphasia noted.  Test the following cranial nerves: Improved right facial weakness and also improved right lateral rectus weakness, reports improved diplopia to right gaze. Otherwise rest of the cranial nerves 2-5 and 8-12 were grossly intact Motor examination: Normal tone, bulk, full 5/5 motor strength in all 4 extremities Examination of sensation : Normal and symmetric sensation to pinprick in all 4  extremities and on face Examination of deep tendon reflexes: 2+, normal and symmetric in all extremities, normal plantars bilaterally Test coordination: Normal finger nose testing, with no evidence of limb appendicular ataxia or abnormal involuntary movements or tremors noted.  Gait: Deferred   Lab Results: Basic Metabolic Panel:  Recent Labs Lab 08/22/15 0326 08/22/15 0342 08/24/15 1422 08/25/15 0715  NA 142 141 142 140  K 3.6 3.8 3.9 4.4  CL 103 103 104 104  CO2  --  25 28 23   GLUCOSE 112* 115* 109* 153*  BUN 12 11 6 6   CREATININE 1.00 1.11 0.86 1.02  CALCIUM  --  9.2 9.7 9.9    Liver Function Tests:  Recent Labs Lab 08/22/15 0342 08/25/15 0715  AST 25 21  ALT 14* 14*  ALKPHOS 47 55  BILITOT 0.5 0.7  PROT 5.8* 6.6  ALBUMIN 3.7 4.1   No results for input(s): LIPASE, AMYLASE in the last 168 hours. No results for input(s): AMMONIA in the last 168 hours.  CBC:  Recent Labs Lab 08/22/15 0326 08/22/15 0342 08/24/15 1422 08/25/15 0237 08/25/15 0715  WBC  --  6.1 5.4  --  3.8*  NEUTROABS  --  1.9  --   --  3.2  HGB 16.0 14.6 15.7  --  15.7  HCT 47.0 42.4 45.9  --  45.5  MCV  --  86.7 86.6  --  86.2  PLT  --  254 254 284 270    Cardiac Enzymes: No results for input(s): CKTOTAL, CKMB, CKMBINDEX, TROPONINI in the last 168 hours.  Lipid Panel:  Recent Labs Lab 08/25/15 0135  CHOL 169  TRIG 109  HDL 54  CHOLHDL 3.1  VLDL 22  LDLCALC 93    CBG: No results for input(s): GLUCAP in the last 168 hours.  Microbiology: Results for orders placed or performed during the hospital encounter of 08/24/15  CSF culture     Status: None (Preliminary result)   Collection Time: 08/24/15 10:29 PM  Result Value Ref Range Status   Specimen Description CSF  Final   Special Requests TUBE 2  Final   Gram Stain   Final    CYTOSPIN SMEAR WBC PRESENT, PREDOMINANTLY MONONUCLEAR NO ORGANISMS SEEN    Culture NO GROWTH < 24 HOURS  Final   Report Status PENDING   Incomplete  Gram stain     Status: None   Collection Time: 08/24/15 10:29 PM  Result Value Ref Range Status   Specimen Description CSF  Final   Special Requests Normal  Final   Gram Stain   Final    CYTOSPIN SMEAR WBC PRESENT, PREDOMINANTLY MONONUCLEAR NO ORGANISMS SEEN    Report Status 08/25/2015 FINAL  Final  Imaging: Dg Chest 2 View  08/24/2015  CLINICAL DATA:  Dizziness, nausea and right facial numbness since 08/22/2015. Initial encounter. EXAM: CHEST  2 VIEW COMPARISON:  None. FINDINGS: The lungs are clear. Heart size is normal. There is no pneumothorax or pleural effusion. No focal bony abnormality. IMPRESSION: Negative chest. Electronically Signed   By: Drusilla Kanner M.D.   On: 08/24/2015 18:59   Mr Laqueta Jean Contrast  08/24/2015  CLINICAL DATA:  Worsening right facial weakness for the past couple of days. Recently seen for dizziness/vertigo with abnormal brain MRI demonstrating a brainstem lesion. EXAM: MRI HEAD WITH CONTRAST TECHNIQUE: Multiplanar, multiecho pulse sequences of the brain and surrounding structures were obtained with intravenous contrast. COMPARISON:  Brain MRI 08/22/2015 CONTRAST:  15 mL MultiHance FINDINGS: A T2 hyperintense lesion is again seen in the right pontomedullary junction measuring approximately 8 mm, similar in size to the recent prior brain MRI. No significant enhancement is seen on axial postcontrast images, however there is mild enhancement on the more delayed coronal and sagittal images, with enhancement greater peripherally than centrally on the coronal series (series 4, image 13). No abnormal enhancement is identified elsewhere in the brain. IMPRESSION: Similar size of 8 mm lesion at the pontomedullary junction, although now with new/ increased enhancement. The differential diagnosis remains as previously reported. An active demyelinating lesion remains a consideration, with other inflammatory or infectious processes as well as small neoplasm also  possible. Electronically Signed   By: Sebastian Ache M.D.   On: 08/24/2015 21:30   Mr Laqueta Jean ZO Contrast  08/22/2015  CLINICAL DATA:  25 year old with gradual onset of dizziness 3 days ago. Associated nausea with right-sided facial numbness and tingling. Off balance with weakness in extremities more than usual. Initial encounter. EXAM: MRI HEAD WITHOUT AND WITH CONTRAST TECHNIQUE: Multiplanar, multiecho pulse sequences of the brain and surrounding structures were obtained without and with intravenous contrast. CONTRAST:  15mL MULTIHANCE GADOBENATE DIMEGLUMINE 529 MG/ML IV SOLN COMPARISON:  None. FINDINGS: 9 mm rounded area of altered signal intensity dorsal lateral right pontomedullary junction does not enhance. No associated gradient abnormality as may be expected with a cavernoma. Etiology indeterminate. It is possible this is related to demyelinating process (although no other white matter findings detected), low grade tumor, infectious process or inflammatory process. Subacute infarct felt be less likely consideration. This abnormality causes mild local mass effect and is in a critical region. If there is growth of this abnormality, this could narrow the outlet of the fourth ventricle. Currently only minimal narrowing right lateral aspect of the lower fourth ventricle. No hydrocephalus. Minimally low lying right cerebellar tonsil within the range normal limits. I would favor neurology consult with clinical and laboratory evaluation with close follow-up MR to help determine if there is a reversible component of this lesion prior to assuming that this represents a low-grade tumor. No acute thrombotic infarct or intracranial hemorrhage. No hydrocephalus. Major intracranial vascular structures are patent. Mild exophthalmos. 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 Etiology indeterminate with above described considerations and recommended follow-up.  Preliminary results discussed with Dr. Freida Busman by Dr. Gershon Mussel prior to contrast-enhanced imaging. Electronically Signed   By: Lacy Duverney M.D.   On: 08/22/2015 07:06   Mr Cervical Spine W Wo Contrast  08/24/2015  CLINICAL DATA:  Worsening right facial weakness. Recently seen for dizziness and vertigo. Small nonenhancing lesion at the pontomedullary junction on recent brain MRI. EXAM: MRI CERVICAL SPINE WITHOUT AND WITH CONTRAST;  MRI THORACIC SPINE WITHOUT AND WITH CONTRAST TECHNIQUE: Multiplanar and multiecho pulse sequences of the cervical spine, to include the craniocervical junction and cervicothoracic junction, were obtained according to standard protocol without and with intravenous contrast.; Multiplanar and multiecho pulse sequences of the thoracic spine were obtained without and with intravenous contrast. CONTRAST:  15mL MULTIHANCE GADOBENATE DIMEGLUMINE 529 MG/ML IV SOLN COMPARISON:  Brain MRI 08/22/2015 FINDINGS: CERVICAL SPINE: An approximately 7 mm T2 hyperintense lesion is again seen in the right aspect of the pontomedullary junction. See concurrent postcontrast brain MRI as well as recent pre and postcontrast brain MRI. There is slight reversal of the normal cervical lordosis. There is no listhesis. Vertebral body heights are preserved. No vertebral marrow edema is seen. The cervical spinal cord is normal in caliber and signal. No abnormal enhancement is identified in the cervical spine. No disc herniation, spinal stenosis, there is minimal disc bulging at C4-5 without stenosis. Paraspinal soft tissues are unremarkable. THORACIC SPINE: Vertebral alignment is normal. Vertebral body heights are preserved. No vertebral marrow edema is seen. Intervertebral discs are well hydrated and maintained in height. The thoracic spinal cord is normal in caliber and signal. No abnormal enhancement is identified. No disc herniation, spinal stenosis, or neural foraminal stenosis is seen. Paraspinal soft tissues  are unremarkable. IMPRESSION: Unremarkable appearance of the cervical and thoracic spinal cord. Electronically Signed   By: Sebastian Ache M.D.   On: 08/24/2015 20:45   Mr Thoracic Spine W Wo Contrast  08/24/2015  CLINICAL DATA:  Worsening right facial weakness. Recently seen for dizziness and vertigo. Small nonenhancing lesion at the pontomedullary junction on recent brain MRI. EXAM: MRI CERVICAL SPINE WITHOUT AND WITH CONTRAST; MRI THORACIC SPINE WITHOUT AND WITH CONTRAST TECHNIQUE: Multiplanar and multiecho pulse sequences of the cervical spine, to include the craniocervical junction and cervicothoracic junction, were obtained according to standard protocol without and with intravenous contrast.; Multiplanar and multiecho pulse sequences of the thoracic spine were obtained without and with intravenous contrast. CONTRAST:  15mL MULTIHANCE GADOBENATE DIMEGLUMINE 529 MG/ML IV SOLN COMPARISON:  Brain MRI 08/22/2015 FINDINGS: CERVICAL SPINE: An approximately 7 mm T2 hyperintense lesion is again seen in the right aspect of the pontomedullary junction. See concurrent postcontrast brain MRI as well as recent pre and postcontrast brain MRI. There is slight reversal of the normal cervical lordosis. There is no listhesis. Vertebral body heights are preserved. No vertebral marrow edema is seen. The cervical spinal cord is normal in caliber and signal. No abnormal enhancement is identified in the cervical spine. No disc herniation, spinal stenosis, there is minimal disc bulging at C4-5 without stenosis. Paraspinal soft tissues are unremarkable. THORACIC SPINE: Vertebral alignment is normal. Vertebral body heights are preserved. No vertebral marrow edema is seen. Intervertebral discs are well hydrated and maintained in height. The thoracic spinal cord is normal in caliber and signal. No abnormal enhancement is identified. No disc herniation, spinal stenosis, or neural foraminal stenosis is seen. Paraspinal soft tissues are  unremarkable. IMPRESSION: Unremarkable appearance of the cervical and thoracic spinal cord. Electronically Signed   By: Sebastian Ache M.D.   On: 08/24/2015 20:45    Assessment and plan:   Garrett Gonzales is an 25 y.o. male patient with improved right facial weakness and right lateral rectus weakness with IV steroids. Abnormal contrast enhancement of the right pontomedullary demyelinating lesion is seen suggestive of an acute inflammatory lesion, suspect that this most likely is secondary to multiple sclerosis although no other lesions are noted to provide a  definitive diagnosis at this time.  CSF Oligoclonal bands, myelin basic protein, IgG index results still pending . Recommend to complete 5 days of IV Solu-Medrol 250 mg every 6 hours. If case manager can arrange for a home health IV  infusion, he can be discharged home tomorrow morning to complete his remaining 4 days of iv Solu-Medrol, can change dose to 500 mg every 12 hours at home. Advised follow-up with outpatient neurologist specialized in MS.  Neurology service will continue to follow up during her hospitalization. Please call for any further questions.

## 2015-08-25 NOTE — Progress Notes (Signed)
ADDENDUM:  DIC panel shows normal PT/ INR, PTT, fibrinogen and d-dimer, no schistocytes.  I have discussed the aberrant result obtained last night w lab director Larna Daughters. He is investigating the cause of the error and will initiate a safety zone portal.   I am cancelling the extensive labwork initiated by this erroneous result last night.  I am signing out at this point. Please let me know if I can be of further help.

## 2015-08-25 NOTE — ED Notes (Signed)
CRITICAL VALUE ALERT  Critical value received: INR >10

## 2015-08-25 NOTE — Evaluation (Signed)
Physical Therapy Evaluation Patient Details Name: Garrett Gonzales MRN: [id number] DOB: 09-30-1990 Today's Date: 08/25/2015   History of Present Illness  25 y.o. male admitted with right facial weakness, dizziness. PMH: Tobacco abuse, drug abuse, alcohol abuse.   Clinical Impression  Pt admitted with above diagnosis. Pt currently with functional limitations due to the deficits listed below (see PT Problem List).  Pt will benefit from skilled PT to increase their independence and safety with mobility to allow discharge to the venue listed below.  Patient with mild instability with initial standing and ambulation but improving with time. Anticipating patient will D/C to home with girlfriend's assistance (per patient reports). PT to follow acutely as needed.     Follow Up Recommendations No PT follow up;Supervision for mobility/OOB    Equipment Recommendations  None recommended by PT    Recommendations for Other Services       Precautions / Restrictions Precautions Precautions: None Restrictions Weight Bearing Restrictions: No      Mobility  Bed Mobility Overal bed mobility: Independent             General bed mobility comments: supine to sit  Transfers Overall transfer level: Needs assistance Equipment used: None Transfers: Sit to/from Stand Sit to Stand: Supervision         General transfer comment: supervision for safety, mild instability with initial standing.   Ambulation/Gait Ambulation/Gait assistance: Supervision Ambulation Distance (Feet): 200 Feet Assistive device: None Gait Pattern/deviations: WFL(Within Functional Limits) Gait velocity: WFL   General Gait Details: no gross loss of balance, mild instability initially but improving with time. patient reports mild dizziness while ambulating  Stairs            Wheelchair Mobility    Modified Rankin (Stroke Patients Only)       Balance Overall balance assessment: Needs  assistance Sitting-balance support: No upper extremity supported Sitting balance-Leahy Scale: Normal     Standing balance support: No upper extremity supported Standing balance-Leahy Scale: Fair                               Pertinent Vitals/Pain Pain Assessment: No/denies pain    Home Living Family/patient expects to be discharged to:: Private residence Living Arrangements: Non-relatives/Friends Available Help at Discharge: Other (Comment);Available 24 hours/day (girlfriend) Type of Home: Apartment Home Access: Stairs to enter   Entrance Stairs-Number of Steps: 2 Home Layout: One level Home Equipment: None      Prior Function Level of Independence: Independent               Hand Dominance        Extremity/Trunk Assessment   Upper Extremity Assessment: Overall WFL for tasks assessed           Lower Extremity Assessment: Overall WFL for tasks assessed         Communication   Communication: No difficulties;Other (comment) (slurred speech)  Cognition Arousal/Alertness: Awake/alert Behavior During Therapy: WFL for tasks assessed/performed Overall Cognitive Status: Within Functional Limits for tasks assessed                      General Comments      Exercises        Assessment/Plan    PT Assessment Patient needs continued PT services  PT Diagnosis Difficulty walking   PT Problem List Decreased balance;Decreased activity tolerance  PT Treatment Interventions Gait training;Stair training;Functional mobility training;Balance training;Patient/family education   PT  Goals (Current goals can be found in the Care Plan section) Acute Rehab PT Goals Patient Stated Goal: go home PT Goal Formulation: With patient Time For Goal Achievement: 09/08/15 Potential to Achieve Goals: Good    Frequency Min 3X/week   Barriers to discharge        Co-evaluation               End of Session Equipment Utilized During Treatment: Gait  belt Activity Tolerance: Patient tolerated treatment well Patient left: in chair;with call bell/phone within reach;with family/visitor present Nurse Communication: Mobility status         Time: 0454-0981 PT Time Calculation (min) (ACUTE ONLY): 19 min   Charges:   PT Evaluation $PT Eval Moderate Complexity: 1 Procedure     PT G Codes:        Christiane Ha, PT, CSCS Pager [phone] Office [phone]  08/25/2015, 10:21 AM

## 2015-08-25 NOTE — Progress Notes (Signed)
Patient arrived from ED via tech complaining of a HA. Oriented to equipment and room. Will administer pain medicine and continue to monitor. Suzy Bouchard E, RN 08/25/2015 12:40 AM

## 2015-08-25 NOTE — Care Management Note (Signed)
Case Management Note  Patient Details  Name: Garrett Gonzales MRN: [id number] Date of Birth: 16-Apr-1991  Subjective/Objective:                    Action/Plan: Patient was admitted with right facial weakness, dizziness. Lives with roommates. Patient is currently listed as self-pay and is being followed by [facility] in [employer].  CM will follow for discharge needs.  Expected Discharge Date:                  Expected Discharge Plan:     In-House Referral:     Discharge planning Services     Post Acute Care Choice:    Choice offered to:     DME Arranged:    DME Agency:     HH Arranged:    HH Agency:     Status of Service:  In process, will continue to follow  Medicare Important Message Given:    Date Medicare IM Given:    Medicare IM give by:    Date Additional Medicare IM Given:    Additional Medicare Important Message give by:     If discussed at Long Length of Stay Meetings, dates discussed:    Additional CommentsAnda Kraft, RN 08/25/2015, 9:22 AM [phone]

## 2015-08-26 LAB — PROTEIN C, TOTAL: Protein C, Total: 115 % (ref 60–150)

## 2015-08-26 LAB — LUPUS ANTICOAGULANT PANEL
DRVVT: 39.5 s (ref 0.0–44.0)
PTT LA: 37.4 s (ref 0.0–40.6)

## 2015-08-26 LAB — HERPES SIMPLEX VIRUS(HSV) DNA BY PCR
HSV 1 DNA: NEGATIVE
HSV 2 DNA: NEGATIVE

## 2015-08-26 LAB — PROTEIN C ACTIVITY: PROTEIN C ACTIVITY: 135 % (ref 73–180)

## 2015-08-26 LAB — VDRL, CSF: SYPHILIS VDRL QUANT CSF: NONREACTIVE

## 2015-08-26 LAB — MYELIN BASIC PROTEIN, CSF: Myelin Basic Protein: 5.1 ng/mL — ABNORMAL HIGH (ref 0.0–1.2)

## 2015-08-26 LAB — CARDIOLIPIN ANTIBODIES, IGG, IGM, IGA
Anticardiolipin IgG: <9 GPL U/mL (ref 0–14)
Anticardiolipin IgM: <9 MPL U/mL (ref 0–12)

## 2015-08-26 LAB — ANTINUCLEAR ANTIBODIES, IFA: ANTINUCLEAR ANTIBODIES, IFA: NEGATIVE

## 2015-08-26 LAB — HEMOGLOBIN A1C
Hgb A1c MFr Bld: 5.7 % — ABNORMAL HIGH (ref 4.8–5.6)
MEAN PLASMA GLUCOSE: 117 mg/dL

## 2015-08-26 LAB — HOMOCYSTEINE: Homocysteine: 11.1 umol/L (ref 0.0–15.0)

## 2015-08-26 LAB — PROTEIN S, TOTAL: PROTEIN S AG TOTAL: 123 % (ref 60–150)

## 2015-08-26 LAB — PROTEIN S ACTIVITY: PROTEIN S ACTIVITY: 113 % (ref 63–140)

## 2015-08-26 LAB — CSF IGG: IGG CSF: 0.7 mg/dL (ref 0.0–8.6)

## 2015-08-26 MED ORDER — SODIUM CHLORIDE 0.9 % IV SOLN: 1000.0000 mg | Freq: Every day | INTRAVENOUS | Status: AC

## 2015-08-26 MED ORDER — METHYLPREDNISOLONE SODIUM SUCC 1000 MG IJ SOLR
500.0000 mg | Freq: Two times a day (BID) | INTRAMUSCULAR | Status: DC
  Administered 2015-08-26: 500 mg via INTRAVENOUS
  Filled 2015-08-26 (×2): qty 4

## 2015-08-26 MED ORDER — BACLOFEN 10 MG PO TABS: 10.0000 mg | ORAL_TABLET | Freq: Every day | ORAL | Status: AC

## 2015-08-26 MED ORDER — PROMETHAZINE HCL 25 MG PO TABS: 25.0000 mg | ORAL_TABLET | Freq: Four times a day (QID) | ORAL | Status: AC | PRN

## 2015-08-26 MED ORDER — FAMOTIDINE 20 MG PO TABS: 20.0000 mg | ORAL_TABLET | Freq: Two times a day (BID) | ORAL | Status: AC

## 2015-08-26 MED ORDER — MECLIZINE HCL 25 MG PO TABS: 25.0000 mg | ORAL_TABLET | Freq: Three times a day (TID) | ORAL | Status: AC | PRN

## 2015-08-26 MED ORDER — NICOTINE 21 MG/24HR TD PT24: 21.0000 mg | MEDICATED_PATCH | Freq: Every day | TRANSDERMAL | Status: AC

## 2015-08-26 NOTE — Care Management Note (Signed)
Case Management Note  Patient Details  Name: Garrett Gonzales MRN: [id number] Date of Birth: January 06, 1991  Subjective/Objective:                    Action/Plan: CM went over the patients medications and all meds ordered are on the $4 list at [facility] except the meclizine and the nicotine patch. Patient does not want the patch and CM gave Garrett Gonzales a coupon for the meclizine. Patient also instructed again that he could use the pharmacy at [facility] instead of [facility] if he chooses. Bedside RN updated.   Expected Discharge Date:                  Expected Discharge Plan:  Home/Self Care  In-House Referral:     Discharge planning Services  CM Consult  Post Acute Care Choice:    Choice offered to:     DME Arranged:    DME Agency:     HH Arranged:    HH Agency:     Status of Service:  Completed, signed off  Medicare Important Message Given:    Date Medicare IM Given:    Medicare IM give by:    Date Additional Medicare IM Given:    Additional Medicare Important Message give by:     If discussed at Long Length of Stay Meetings, dates discussed:    Additional Comments:  Garrett Balo, RN 08/26/2015, 2:37 PM

## 2015-08-26 NOTE — Care Management Note (Signed)
Case Management Note  Patient Details  Name: Garrett Gonzales MRN: [id number] Date of Birth: 15-Nov-1990  Subjective/Objective:                    Action/Plan: Plan is for patient to discharge home today with IV steroid treatment through short stay on Thursday and Friday. CM spoke with Lavern in [facility] short stay and arranged for the patient to be seen Thursday and Friday at 0900. CM met with Mr Stroschein and informed him of the the appointments and he is agreeable. Appointments placed on the AVS. Patient also does not have a PCP. He was in agreement with being seen at the [facility]. CM called and made him an appointment at the [facility] clinic who is seeing overflow for [facility]. Appointment placed on AVS. Patient informed that he could take his discharge paperwork and prescriptions at discharge to the [facility] pharmacy and receive his medications at a reduced cost. Patient voiced understanding. Will updated bedside RN.   Expected Discharge Date:                  Expected Discharge Plan:  Home/Self Care  In-House Referral:     Discharge planning Services  CM Consult  Post Acute Care Choice:    Choice offered to:     DME Arranged:    DME Agency:     HH Arranged:    [place] Agency:     Status of Service:  Completed, signed off  Medicare Important Message Given:    Date Medicare IM Given:    Medicare IM give by:    Date Additional Medicare IM Given:    Additional Medicare Important Message give by:     If discussed at [place] of Stay Meetings, dates discussed:    Additional Comments:  Pollie Friar, RN 08/26/2015, 11:07 AM

## 2015-08-26 NOTE — Evaluation (Signed)
Occupational Therapy Evaluation and Discharge Patient Details Name: Garrett Gonzales MRN: [id number] DOB: 22-Jun-1991 Today's Date: 08/26/2015    History of Present Illness 25 y.o. male admitted with right facial weakness, dizziness. PMH: Tobacco abuse, drug abuse, alcohol abuse.    Clinical Impression   This 25 yo male admitted with above presents to acute OT all education completed, we will D/C from acute OT.    Follow Up Recommendations  No OT follow up    Equipment Recommendations  None recommended by OT       Precautions / Restrictions Precautions Precautions: None Restrictions Weight Bearing Restrictions: No      Mobility Bed Mobility Overal bed mobility: Independent                Transfers Overall transfer level: Independent                    Balance Overall balance assessment: No apparent balance deficits (not formally assessed)   Sitting balance-Leahy Scale: Normal       Standing balance-Leahy Scale: Good                              ADL Overall ADL's : Independent                                       General ADL Comments: Did recommend to pt that if his balance is off again it would be a good idea to sit to take a shower not stand. We also talked about that if he does indeed get the diagnosis of MS that he will notice that hold and old temperatures will affect him--he reported that he did note that when he took a hot steemy shower at home before he came to the hospital that it completely wore him out/drained his energy. He asked about ways to help his jaws feel not so tight--I recommend massage, chewing gum may or may not help, and I gave him handout on facial exercises to work on.     Vision Vision Assessment?: Yes Eye Alignment: Within Functional Limits Ocular Range of Motion: Within Functional Limits Alignment/Gaze Preference: Within Defined Limits Tracking/Visual Pursuits:  (when looking to far right  he has trouble maintaing gaze in this direction. ) Convergence: Within functional limits Visual Fields: No apparent deficits Diplopia Assessment:  (describes it as more blurry to far right than double) Additional Comments: Educated pt and girlfriend on pt looking to far right and trying to hold for 5 seconds to work on strengthening of eye muscles. Have asked MD to order pt an eye patch to wear if blurriness or recurrent double vision is really interferring with his function and if so to alternate eyes every hour that he is wearing it. I made him and his girlfriend explicitly aware that he should not be driving untl MD clears him to do so and the patch is not to be used at all for driving--he verbalized understanding. 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 (He works for [employer])          Pertinent Vitals/Pain Pain Assessment: No/denies pain     Hand Dominance Right   Extremity/Trunk Assessment Upper Extremity Assessment Upper Extremity Assessment: Overall WFL for tasks assessed  Communication Communication Communication: Other (comment) (slurred speech)   Cognition Arousal/Alertness: Awake/alert Behavior During Therapy: WFL for tasks assessed/performed Overall Cognitive Status: Within Functional Limits for tasks assessed                                Home Living Family/patient expects to be discharged to:: Private residence Living Arrangements: Non-relatives/Friends Available Help at Discharge: Other (Comment);Available 24 hours/day (girlfriend) Type of Home: Apartment Home Access: Stairs to enter [employer] of Steps: 2   Home Layout: One level     Bathroom Shower/Tub: Producer, television/film/video: Standard     Home Equipment: None          Prior Functioning/Environment Level of Independence: Independent             OT Diagnosis: Disturbance  of vision         OT Goals(Current goals can be found in the care plan section) Acute Rehab OT Goals Patient Stated Goal: go home  OT Frequency:                End of Session Equipment Utilized During Treatment:  (none) Nurse Communication:  (pt should have eye patch when he D/C's)  Activity Tolerance: Patient tolerated treatment well Patient left: in chair;with call bell/phone within reach;with family/visitor present   Time: 1001-1045 OT Time Calculation (min): 44 min Charges:  OT General Charges $OT Visit: 1 Procedure OT Evaluation $OT Eval Moderate Complexity: 1 Procedure OT Treatments $Self Care/Home Management : 8-22 mins $Therapeutic Exercise: 8-22 mins  Evette Georges [phone] 08/26/2015, 11:11 AM

## 2015-08-26 NOTE — Progress Notes (Signed)
Subjective: Feels his right eye movement has improved and no longer seeing double.   Exam: Filed Vitals:   08/26/15 0209 08/26/15 0541  BP: 137/73 136/89  Pulse: 68 66  Temp: 98 F (36.7 C) 97.9 F (36.6 C)  Resp: 18 20        Gen: In bed, NAD MS: alert and oriented, follows commands.  CN: right 7th and partial 6th nerve palsy, speech clear, TML, PERRLA,  Motor: 5/5 throughout Sensory: intact   Pertinent Labs:   Felicie Morn PA-C [facility] [phone]  Impression: 25 y.o. male patient with improved right facial weakness and right lateral rectus weakness with IV steroids. Abnormal contrast enhancement of the right pontomedullary demyelinating lesion is seen suggestive of an acute inflammatory lesion, suspect that this most likely is secondary to multiple sclerosis although no other lesions are noted to provide a definitive diagnosis at this time.     Recommendations: 1) will need 2 more days of steroids. He may get these as out patient and instead of 500 mg BID may give 1 G Q day.  2) Will need out patient neurology follow up.     08/26/2015, 10:16 AM

## 2015-08-26 NOTE — Discharge Summary (Signed)
Physician Discharge Summary   Patient ID: Garrett Gonzales MRN: [id number] DOB/AGE: Mar 11, 1991 25 y.o.  Admit date: 08/24/2015 Discharge date: 08/26/2015  Primary Care Physician:  No PCP Per Patient  Discharge Diagnoses:    . Facial weakness likely due to demyelinating disease of CNS  . Drug abuse . Tobacco abuse . Alcohol abuse . Demyelinating disease of central nervous system, unspecified (HCC)  Consults: Neurology  Recommendations for Outpatient Follow-up:  1. Patient is scheduled at short stay for IV Solu-Medrol 1 g daily for 2 more days 2. Please follow CSF studies for oligoclonal bands, myelin protein   DIET: Heart healthy diet    Allergies:  No Known Allergies   DISCHARGE MEDICATIONS: Current Discharge Medication List    START taking these medications   Details  baclofen (LIORESAL) 10 MG tablet Take 1 tablet (10 mg total) by mouth at bedtime. Qty: 30 each, Refills: 3    famotidine (PEPCID) 20 MG tablet Take 1 tablet (20 mg total) by mouth 2 (two) times daily. Qty: 60 tablet, Refills: 3    methylPREDNISolone sodium succinate 1,000 mg in sodium chloride 0.9 % 50 mL Inject 1,000 mg into the vein daily. X 2 days Qty: 2 ampule, Refills: 0    nicotine (NICODERM CQ - DOSED IN MG/24 HOURS) 21 mg/24hr patch Place 1 patch (21 mg total) onto the skin daily. Qty: 28 patch, Refills: 0      CONTINUE these medications which have CHANGED   Details  meclizine (ANTIVERT) 25 MG tablet Take 1 tablet (25 mg total) by mouth 3 (three) times daily as needed for dizziness. Qty: 60 tablet, Refills: 0    promethazine (PHENERGAN) 25 MG tablet Take 1 tablet (25 mg total) by mouth every 6 (six) hours as needed for nausea or vomiting. Qty: 30 tablet, Refills: 0         Brief H and P: For complete details please refer to admission H and P, but in brief  Per Dr. Clyde Lundborg on 1/23 Garrett Gonzales is a 25 y.o. male with PMH of tobacco abuse, drug abuse and alcohol abuse, presented with  right facial weakness and dizziness for last 6-7 days. He has diplopia some times, but no hearing loss or weakness in extremities. No urinary incontinence or loss control for bowel movement. Pt was seen in Ed on 08/22/15. He had MRI brain which demonstrated a non enhancing, nonspecific 9 mm rounded lesion within the dorsal lateral right pontomedullary junction with mild local mass effect, but no hydrocephalus. Neurology was consulted, Dr. Leroy Kennedy ET patient. Pt was discharge from ER and advised to f/u with outpatient neurology in 2-3 weeks. He came back to the ER today due to worsening right facial weakness. Also reports having chewing difficulty and some mild swallowing problems as well. He continues to have dizziness.  In ED, patient was found to have unremarkable MRI of the cervical and thoracic spine. MRI-brain with contrast showed 8 mm lesion at the pontomedullary junction, with new/ increased enhancement. WBC 6.1, temperature normal, no tachycardia, INR>10, PTT>200, electrolytes and renal function okay, pending urinalysis. Neurology, Dr. Lavon Paganini was consulted by EDP. Lumbar puncture was performed in EDP with pending CSF analysis. Patient was admitted for further workup.  Hospital Course:  Demyelinating disease of central nervous system: With facial numbness, dizziness improving - Neurology was consulted.  - MRI brain showed similar size of 8mm lesion at the pontomedullary junction, DDx include multiple risk process, autoimmune conditions such as sarcoidosis, lupus, infectious, rarely neoplastic - LP done,  negative for meningitis, myelin basic protein, oligoclonal bands pending - Patient was placed on high-dose IV steroids, day #3 today, requires 2 more days, patient scheduled at short stay to receive outpatient steroids - Follow hypercoagulable workup - Continue Pepcid, baclofen  - Per Patient, he had appointment with neurologist at [facility] neurology on the day of his admission and had to miss  it. Patient was recommended to follow up his neurologist in 10-14days. I was unable to make appt for the patient as I was unable to contact their secretory/coordinator. I have also sent the ambulatory referral.   Elevated INR and PTT on admission labs : No signs of infection. No bleeding tendency. Was a lab error, appreciate Dr. Darrall Dears recommendations. DIC panel showed normal INR, PTT and fibrinogen.   Polysubstance abuse: including drug abuse, tobacco abuse and alcohol abuse. -UDS positive for THC -Nicotine patch -CIWA protocol   Day of Discharge BP 151/88 mmHg  Pulse 80  Temp(Src) 97.9 F (36.6 C) (Oral)  Resp 20  Ht  (1.88 m)  Wt 74.39 kg (164 lb)  BMI 21.05 kg/m2  SpO2 100%  Physical Exam: General: Alert and awake oriented x3 not in any acute distress. HEENT: anicteric sclera, pupils reactive to light and accommodation CVS: S1-S2 clear no murmur rubs or gallops Chest: clear to auscultation bilaterally, no wheezing rales or rhonchi Abdomen: soft nontender, nondistended, normal bowel sounds Extremities: no cyanosis, clubbing or edema noted bilaterally Neuro: Cranial nerves II-XII intact, no focal neurological deficits   The results of significant diagnostics from this hospitalization (including imaging, microbiology, ancillary and laboratory) are listed below for reference.    LAB RESULTS: Basic Metabolic Panel:  Recent Labs Lab 08/24/15 1422 08/25/15 0715  NA 142 140  K 3.9 4.4  CL 104 104  CO2 28 23  GLUCOSE 109* 153*  BUN 6 6  CREATININE 0.86 1.02  CALCIUM 9.7 9.9   Liver Function Tests:  Recent Labs Lab 08/22/15 0342 08/25/15 0715  AST 25 21  ALT 14* 14*  ALKPHOS 47 55  BILITOT 0.5 0.7  PROT 5.8* 6.6  ALBUMIN 3.7 4.1   No results for input(s): LIPASE, AMYLASE in the last 168 hours. No results for input(s): AMMONIA in the last 168 hours. CBC:  Recent Labs Lab 08/24/15 1422 08/25/15 0237 08/25/15 0715  WBC 5.4  --  3.8*  NEUTROABS   --   --  3.2  HGB 15.7  --  15.7  HCT 45.9  --  45.5  MCV 86.6  --  86.2  PLT 254 284 270   Cardiac Enzymes: No results for input(s): CKTOTAL, CKMB, CKMBINDEX, TROPONINI in the last 168 hours. BNP: Invalid input(s): POCBNP CBG: No results for input(s): GLUCAP in the last 168 hours.  Significant Diagnostic Studies:  Dg Chest 2 View  08/24/2015  CLINICAL DATA:  Dizziness, nausea and right facial numbness since 08/22/2015. Initial encounter. EXAM: CHEST  2 VIEW COMPARISON:  None. FINDINGS: The lungs are clear. Heart size is normal. There is no pneumothorax or pleural effusion. No focal bony abnormality. IMPRESSION: Negative chest. Electronically Signed   By: Drusilla Kanner M.D.   On: 08/24/2015 18:59   Mr Laqueta Jean Contrast  08/24/2015  CLINICAL DATA:  Worsening right facial weakness for the past couple of days. Recently seen for dizziness/vertigo with abnormal brain MRI demonstrating a brainstem lesion. EXAM: MRI HEAD WITH CONTRAST TECHNIQUE: Multiplanar, multiecho pulse sequences of the brain and surrounding structures were obtained with intravenous contrast. COMPARISON:  Brain  MRI 08/22/2015 CONTRAST:  15 mL MultiHance FINDINGS: A T2 hyperintense lesion is again seen in the right pontomedullary junction measuring approximately 8 mm, similar in size to the recent prior brain MRI. No significant enhancement is seen on axial postcontrast images, however there is mild enhancement on the more delayed coronal and sagittal images, with enhancement greater peripherally than centrally on the coronal series (series 4, image 13). No abnormal enhancement is identified elsewhere in the brain. IMPRESSION: Similar size of 8 mm lesion at the pontomedullary junction, although now with new/ increased enhancement. The differential diagnosis remains as previously reported. An active demyelinating lesion remains a consideration, with other inflammatory or infectious processes as well as small neoplasm also possible.  Electronically Signed   By: Sebastian Ache M.D.   On: 08/24/2015 21:30   Mr Cervical Spine W Wo Contrast  08/24/2015  CLINICAL DATA:  Worsening right facial weakness. Recently seen for dizziness and vertigo. Small nonenhancing lesion at the pontomedullary junction on recent brain MRI. EXAM: MRI CERVICAL SPINE WITHOUT AND WITH CONTRAST; MRI THORACIC SPINE WITHOUT AND WITH CONTRAST TECHNIQUE: Multiplanar and multiecho pulse sequences of the cervical spine, to include the craniocervical junction and cervicothoracic junction, were obtained according to standard protocol without and with intravenous contrast.; Multiplanar and multiecho pulse sequences of the thoracic spine were obtained without and with intravenous contrast. CONTRAST:  15mL MULTIHANCE GADOBENATE DIMEGLUMINE 529 MG/ML IV SOLN COMPARISON:  Brain MRI 08/22/2015 FINDINGS: CERVICAL SPINE: An approximately 7 mm T2 hyperintense lesion is again seen in the right aspect of the pontomedullary junction. See concurrent postcontrast brain MRI as well as recent pre and postcontrast brain MRI. There is slight reversal of the normal cervical lordosis. There is no listhesis. Vertebral body heights are preserved. No vertebral marrow edema is seen. The cervical spinal cord is normal in caliber and signal. No abnormal enhancement is identified in the cervical spine. No disc herniation, spinal stenosis, there is minimal disc bulging at C4-5 without stenosis. Paraspinal soft tissues are unremarkable. THORACIC SPINE: Vertebral alignment is normal. Vertebral body heights are preserved. No vertebral marrow edema is seen. Intervertebral discs are well hydrated and maintained in height. The thoracic spinal cord is normal in caliber and signal. No abnormal enhancement is identified. No disc herniation, spinal stenosis, or neural foraminal stenosis is seen. Paraspinal soft tissues are unremarkable. IMPRESSION: Unremarkable appearance of the cervical and thoracic spinal cord.  Electronically Signed   By: Sebastian Ache M.D.   On: 08/24/2015 20:45   Mr Thoracic Spine W Wo Contrast  08/24/2015  CLINICAL DATA:  Worsening right facial weakness. Recently seen for dizziness and vertigo. Small nonenhancing lesion at the pontomedullary junction on recent brain MRI. EXAM: MRI CERVICAL SPINE WITHOUT AND WITH CONTRAST; MRI THORACIC SPINE WITHOUT AND WITH CONTRAST TECHNIQUE: Multiplanar and multiecho pulse sequences of the cervical spine, to include the craniocervical junction and cervicothoracic junction, were obtained according to standard protocol without and with intravenous contrast.; Multiplanar and multiecho pulse sequences of the thoracic spine were obtained without and with intravenous contrast. CONTRAST:  15mL MULTIHANCE GADOBENATE DIMEGLUMINE 529 MG/ML IV SOLN COMPARISON:  Brain MRI 08/22/2015 FINDINGS: CERVICAL SPINE: An approximately 7 mm T2 hyperintense lesion is again seen in the right aspect of the pontomedullary junction. See concurrent postcontrast brain MRI as well as recent pre and postcontrast brain MRI. There is slight reversal of the normal cervical lordosis. There is no listhesis. Vertebral body heights are preserved. No vertebral marrow edema is seen. The cervical spinal cord is  normal in caliber and signal. No abnormal enhancement is identified in the cervical spine. No disc herniation, spinal stenosis, there is minimal disc bulging at C4-5 without stenosis. Paraspinal soft tissues are unremarkable. THORACIC SPINE: Vertebral alignment is normal. Vertebral body heights are preserved. No vertebral marrow edema is seen. Intervertebral discs are well hydrated and maintained in height. The thoracic spinal cord is normal in caliber and signal. No abnormal enhancement is identified. No disc herniation, spinal stenosis, or neural foraminal stenosis is seen. Paraspinal soft tissues are unremarkable. IMPRESSION: Unremarkable appearance of the cervical and thoracic spinal cord.  Electronically Signed   By: Sebastian Ache M.D.   On: 08/24/2015 20:45    2D ECHO:   Disposition and Follow-up: Discharge Instructions    Ambulatory referral to Neurology    Complete by:  As directed   An appointment is requested in approximately: 2 weeks  [facility]. Patient had appt on the day of admission.            DISPOSITION: Home  DISCHARGE FOLLOW-UP Follow-up Information    Follow up with [facility] SHORT STAY.   Why:  you have an appointment Thursday 1/26 at 0900 and Friday 1/27 at 0900   Contact information:   [address] [id number] mc [place] [address] [phone]      Follow up with [facility].   Why:  Your appointment is Feb 23rd at 2:30 PM. Please bring your current medications and a picture ID.    Contact information:   [address] [place] [address]       Schedule an appointment as soon as possible for a visit in 2 weeks to follow up.   Why:  for hospital follow-up   Contact information:   please call your neurologist at [facility]        Time spent on Discharge:   Signed:   Viral Schramm M.D. [facility] 08/26/2015, 2:36 PM Pager: [phone]

## 2015-08-26 NOTE — Progress Notes (Signed)
Discharge orders received, Pt for discharge home today with home health PT per [facility]. IV d/c'd. D/c instructions and RX given with verbalized understanding. Family at bedside to assist patient with discharge. Staff bought pt downstairs via wheelchair. 08/26/15 1638

## 2015-08-27 ENCOUNTER — Encounter (HOSPITAL_COMMUNITY)
Admit: 2015-08-27 | Discharge: 2015-08-27 | Disposition: A | Discharge status: Home / Self Care | Payer: BLUE CROSS/BLUE SHIELD | Source: Ambulatory Visit | Attending: Internal Medicine | Admitting: Internal Medicine

## 2015-08-27 DIAGNOSIS — R42 Dizziness and giddiness: Secondary | ICD-10-CM | POA: Diagnosis not present

## 2015-08-27 DIAGNOSIS — R200 Anesthesia of skin: Secondary | ICD-10-CM | POA: Insufficient documentation

## 2015-08-27 LAB — BETA-2-GLYCOPROTEIN I ABS, IGG/M/A
Beta-2 Glyco I IgG: <9 GPI IgG units (ref 0–20)
Beta-2-Glycoprotein I IgA: <9 GPI IgA units (ref 0–25)
Beta-2-Glycoprotein I IgM: <9 GPI IgM units (ref 0–32)

## 2015-08-27 MED ORDER — SODIUM CHLORIDE 0.9 % IV SOLN
1000.0000 mg | Freq: Every day | INTRAVENOUS | Status: DC
  Administered 2015-08-27: 1000 mg via INTRAVENOUS
  Filled 2015-08-27: qty 8

## 2015-08-28 ENCOUNTER — Ambulatory Visit (HOSPITAL_COMMUNITY)
Admit: 2015-08-28 | Discharge: 2015-08-28 | Disposition: A | Discharge status: Home / Self Care | Payer: BLUE CROSS/BLUE SHIELD | Source: Ambulatory Visit | Attending: Internal Medicine | Admitting: Internal Medicine

## 2015-08-28 DIAGNOSIS — R42 Dizziness and giddiness: Secondary | ICD-10-CM | POA: Insufficient documentation

## 2015-08-28 DIAGNOSIS — R200 Anesthesia of skin: Secondary | ICD-10-CM | POA: Diagnosis present

## 2015-08-28 LAB — CSF CULTURE W GRAM STAIN

## 2015-08-28 LAB — ANGIOTENSIN CONVERTING ENZYME, CSF: ANGIO CONVERT ENZYME: 1 U/L (ref 0.0–2.5)

## 2015-08-28 LAB — CSF CULTURE: CULTURE: NO GROWTH

## 2015-08-28 LAB — OLIGOCLONAL BANDS, CSF + SERM

## 2015-08-28 MED ORDER — SODIUM CHLORIDE 0.9 % IV SOLN
1000.0000 mg | Freq: Every day | INTRAVENOUS | Status: AC
  Administered 2015-08-28: 1000 mg via INTRAVENOUS
  Filled 2015-08-28: qty 8

## 2015-08-28 NOTE — Discharge Instructions (Signed)
Excuse from Work, [employer], or Physical Activity ________________________[name]__________________________ needs to be excused from: x_____ Work _x____ [employer] Until he has been medically cleared BY the Physician.   _____ Physical activity beginning now and through the following date: ___________________. _____ He or she may return to work or school but should still avoid the following physical activity or activities from now until ____________________. Activity restrictions include: _____ Lifting more than _______ lb _____ Sitting longer than __________ minutes at a time _____ Standing longer than ________ minutes at a time _____ He or she may return to full physical activity as of ____________________. Health Care Provider Name (printed): _______Renee Peggye Pitt RN_________________________________  [facility] Provider (signature): ___________________________________________ Date: __________01/27/2017______   This information is not intended to replace advice given to you by your health care provider. Make sure you discuss any questions you have with your health care provider.   Document Released: 01/11/2001 Document Revised: 08/08/2014 Document Reviewed: 02/17/2014 Elsevier Interactive Patient Education [employer]

## 2015-08-31 ENCOUNTER — Ambulatory Visit: Payer: BLUE CROSS/BLUE SHIELD | Admitting: Diagnostic Neuroimaging

## 2015-08-31 ENCOUNTER — Telehealth (HOSPITAL_BASED_OUTPATIENT_CLINIC_OR_DEPARTMENT_OTHER): Payer: Self-pay | Admitting: Emergency Medicine

## 2015-08-31 ENCOUNTER — Other Ambulatory Visit: Payer: Self-pay | Admitting: Internal Medicine

## 2015-08-31 ENCOUNTER — Ambulatory Visit: Payer: BLUE CROSS/BLUE SHIELD | Admitting: Neurology

## 2015-08-31 LAB — PROTHROMBIN GENE MUTATION

## 2015-08-31 LAB — FACTOR 5 LEIDEN

## 2015-09-01 ENCOUNTER — Encounter: Payer: Self-pay | Admitting: Diagnostic Neuroimaging

## 2015-09-01 ENCOUNTER — Ambulatory Visit: Payer: BLUE CROSS/BLUE SHIELD | Admitting: Neurology

## 2015-09-01 ENCOUNTER — Telehealth: Payer: Self-pay

## 2015-09-01 NOTE — Telephone Encounter (Signed)
I LM for patient on VM. We need to cancel his appt with Dr. Frances Furbish today and patient needs to see Dr. Epimenio Foot. Dr. Epimenio Foot has an opening on 2/1 at 9am (8:40 arrival time). The ED reported concerns for possible Demyelinating disease of central nervous system, unspecified (HCC), for which it is more appropriate to see Dr. Epimenio Foot. Faith has put patient on Dr. Bonnita Hollow schedule for 2/1 at 9am. I asked for patient to call back and confirm.

## 2015-09-02 ENCOUNTER — Ambulatory Visit: Payer: Self-pay | Admitting: Neurology

## 2015-09-08 ENCOUNTER — Ambulatory Visit: Payer: Self-pay | Admitting: Neurology

## 2015-09-10 ENCOUNTER — Encounter: Payer: Self-pay | Admitting: Neurology

## 2015-09-24 ENCOUNTER — Ambulatory Visit: Payer: BLUE CROSS/BLUE SHIELD | Admitting: Family Medicine

## 2015-09-29 ENCOUNTER — Ambulatory Visit: Payer: BLUE CROSS/BLUE SHIELD | Admitting: Neurology

## 2015-09-29 DIAGNOSIS — Z029 Encounter for administrative examinations, unspecified: Secondary | ICD-10-CM

## 2016-01-11 ENCOUNTER — Emergency Department (HOSPITAL_COMMUNITY)
Admission: EM | Admit: 2016-01-11 | Discharge: 2016-01-11 | Disposition: A | Discharge status: Home / Self Care | Payer: BLUE CROSS/BLUE SHIELD

## 2016-01-11 NOTE — ED Notes (Signed)
Pt.gotten mad due to wait time .punching the wall and left.

## 2017-10-10 IMAGING — MR MR CERVICAL SPINE WO/W CM
10 of 22 series · 19 of 48 positions shown · IV contrast (multihance)
Comparison: Brain MRI 08/22/2015

CLINICAL DATA: Worsening right facial weakness. Recently seen for
dizziness and vertigo. Small nonenhancing lesion at the
pontomedullary junction on recent brain MRI.

EXAM:
MRI CERVICAL SPINE WITHOUT AND WITH CONTRAST; MRI THORACIC SPINE
WITHOUT AND WITH CONTRAST
TECHNIQUE: Multiplanar and multiecho pulse sequences of the cervical spine, to
include the craniocervical junction and cervicothoracic junction,
were obtained according to standard protocol without and with
intravenous contrast.; Multiplanar and multiecho pulse sequences of
the thoracic spine were obtained without and with intravenous
contrast.
CONTRAST:  15mL MULTIHANCE GADOBENATE DIMEGLUMINE 529 MG/ML IV SOLN

[Series 3: T2 · sagittal · 3.0mm · 0.43mm/px · 1 of 13 slices shown (1 of 6)]
[im 1/13]
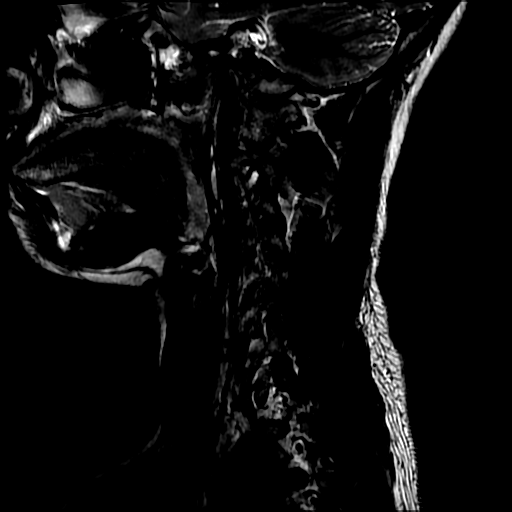

[Series 7: T2 · axial · 3.0mm · 0.35mm/px · z∈[-212,-96]mm · 2 of 36 slices shown (2 of 6)]
[im 1/36]
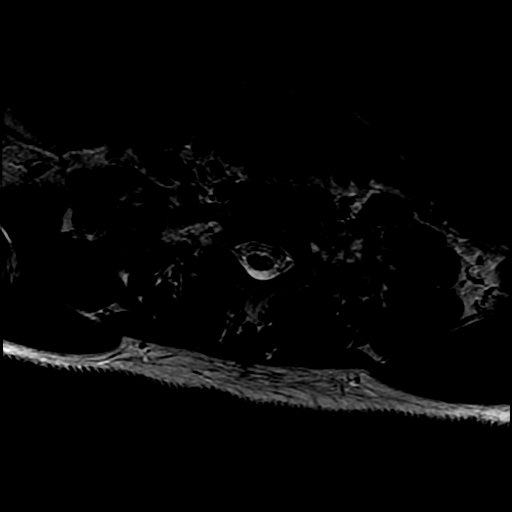
[im 36/36]
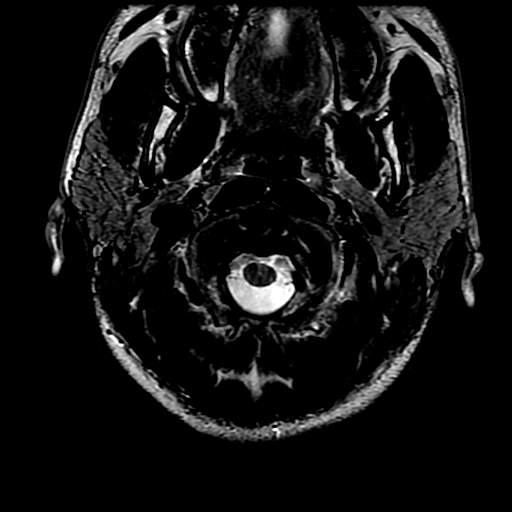

[Series 8: T1 · axial · non-contrast · 3.0mm · 0.35mm/px · z∈[-212,-96]mm · 2 of 36 slices shown (1 of 4)]
[im 1/36]
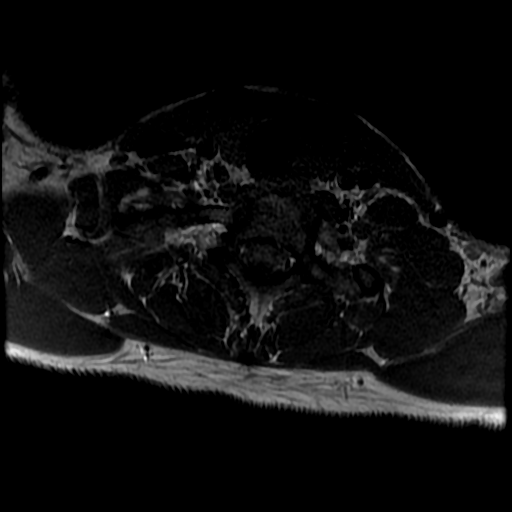
[im 36/36]
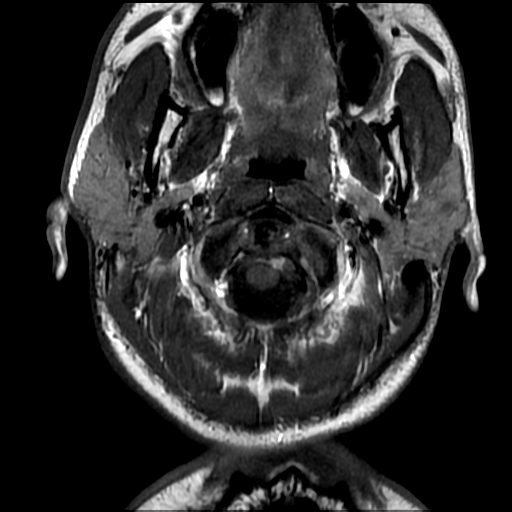

[Series 12: T1 · sagittal · 3.0mm · 0.90mm/px · 1 of 13 slices shown (2 of 4)]
[im 1/13]
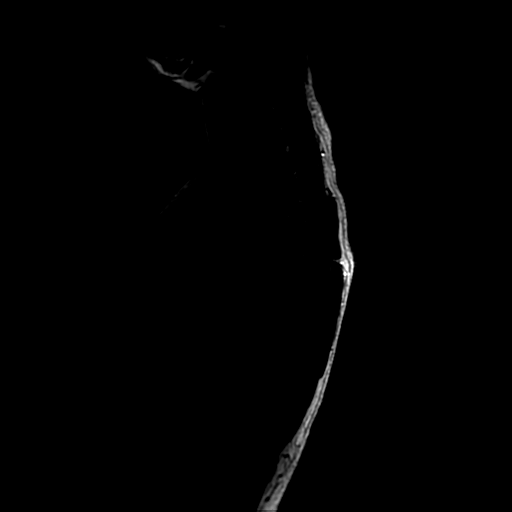

[Series 13: T2 · sagittal · 3.0mm · 0.66mm/px · 1 of 15 slices shown (3 of 6)]
[im 1/15]
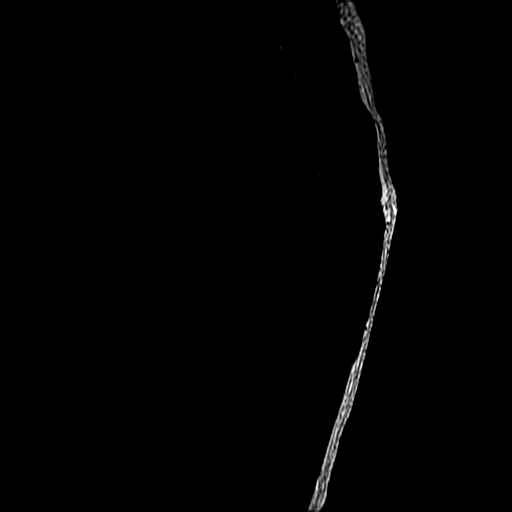

[Series 14: T1 · sagittal · 3.0mm · 0.66mm/px · 2 of 17 slices shown (3 of 4)]
[im 1/17]
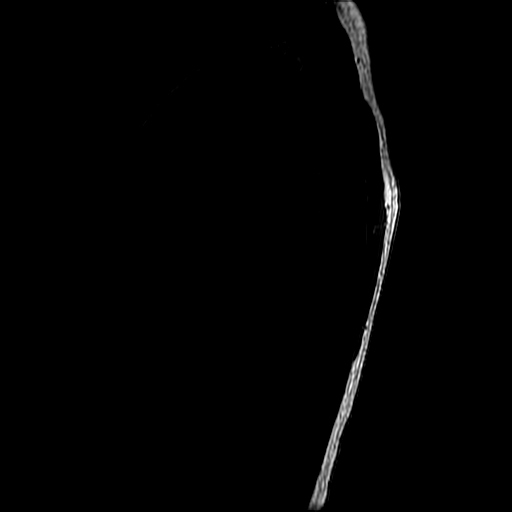
[im 17/17]
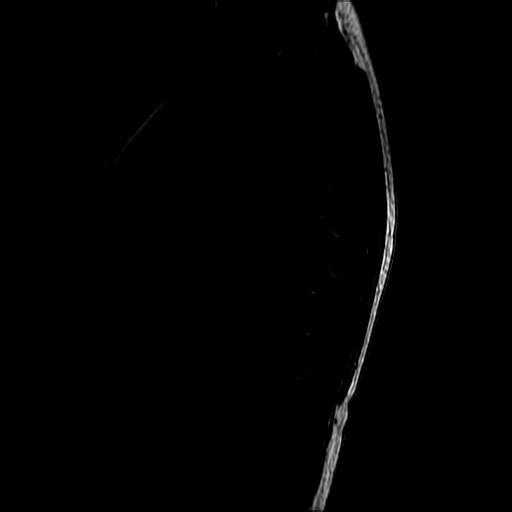

[Series 16: T2 · sagittal · 3.0mm · 0.66mm/px · 2 of 17 slices shown (4 of 6)]
[im 1/17]
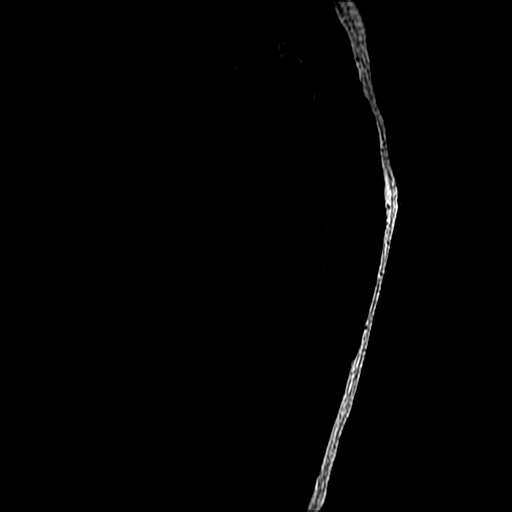
[im 17/17]
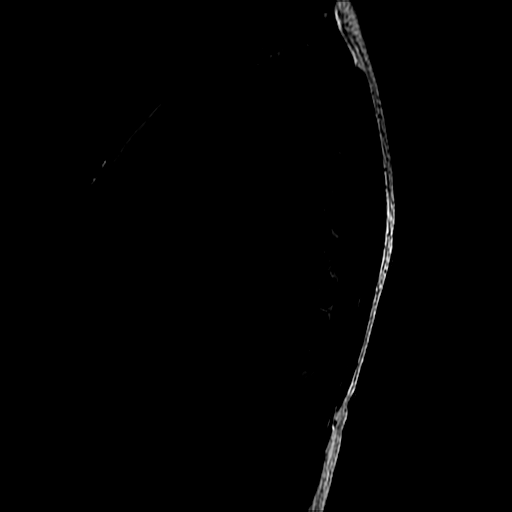

[Series 17: T2 · axial · 4.0mm · 0.39mm/px · z∈[-341,-189]mm · 3 of 30 slices shown (5 of 6)]
[im 1/30]
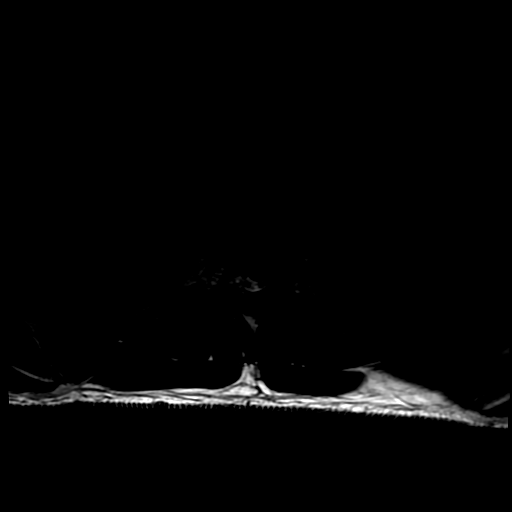
[im 15/30]
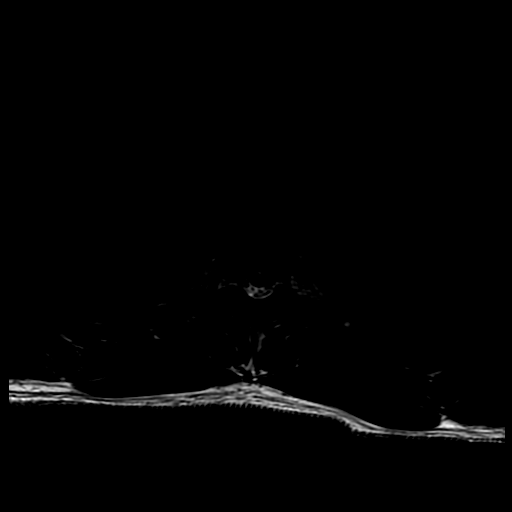
[im 30/30]
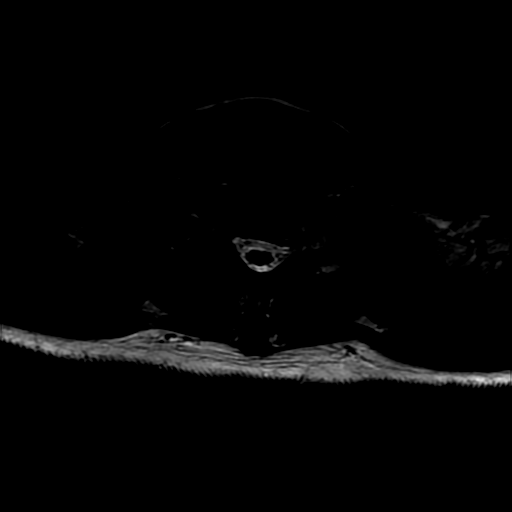

[Series 19: T2 · axial · 4.0mm · 0.39mm/px · z∈[-473,-309]mm · 3 of 31 slices shown (6 of 6)]
[im 1/31]
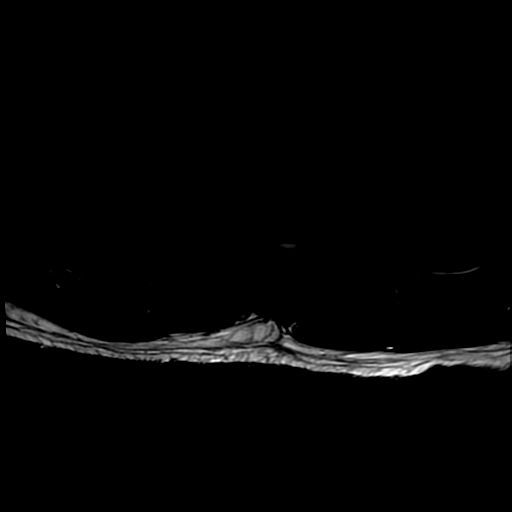
[im 16/31]
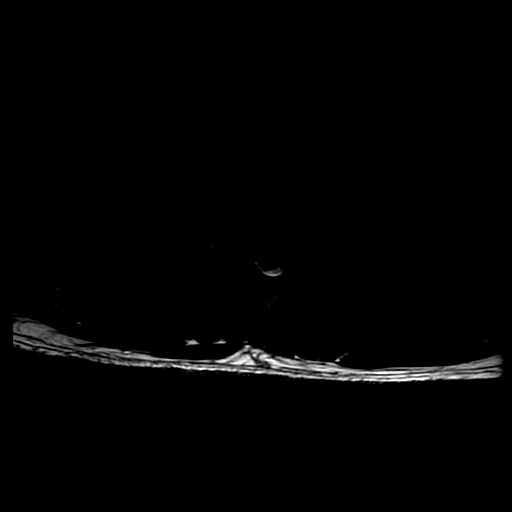
[im 31/31]
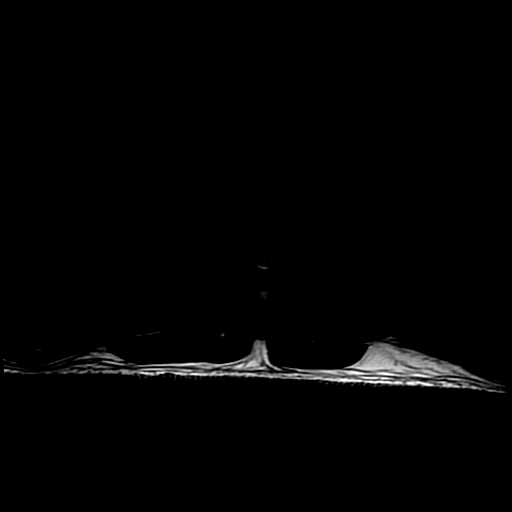

[Series 21: T1 · axial · non-contrast · 4.0mm · 0.39mm/px · z∈[-341,-268]mm · 2 of 30 slices shown (4 of 4)]
[im 1/30]
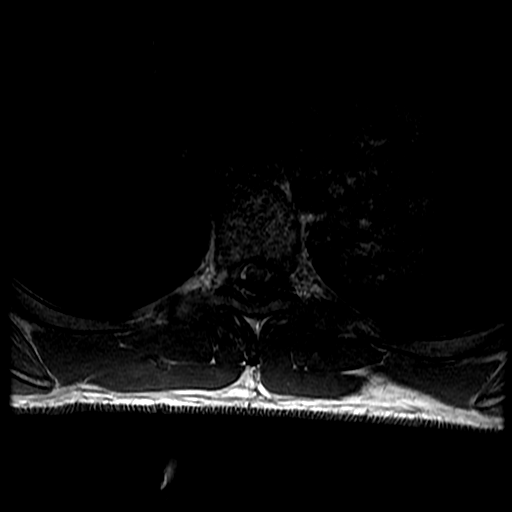
[im 15/30]
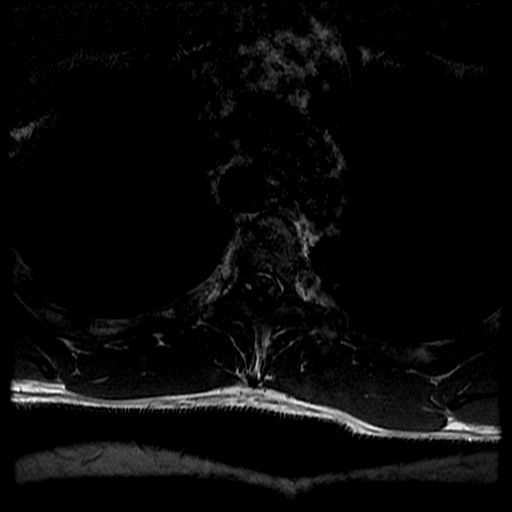

[19 of 48 positions shown; findings below may reference images not displayed]

FINDINGS: CERVICAL SPINE:

An approximately 7 mm T2 hyperintense lesion is again seen in the
right aspect of the pontomedullary junction. See concurrent
postcontrast brain MRI as well as recent pre and postcontrast brain
MRI.

There is slight reversal of the normal cervical lordosis. There is
no listhesis. Vertebral body heights are preserved. No vertebral
marrow edema is seen.

The cervical spinal cord is normal in caliber and signal. No
abnormal enhancement is identified in the cervical spine. No disc
herniation, spinal stenosis, there is minimal disc bulging at C4-5
without stenosis. Paraspinal soft tissues are unremarkable.

THORACIC SPINE:

Vertebral alignment is normal. Vertebral body heights are preserved.
No vertebral marrow edema is seen. Intervertebral discs are well
hydrated and maintained in height. The thoracic spinal cord is
normal in caliber and signal. No abnormal enhancement is identified.
No disc herniation, spinal stenosis, or neural foraminal stenosis is
seen. Paraspinal soft tissues are unremarkable.
IMPRESSION: Unremarkable appearance of the cervical and thoracic spinal cord.
